# Patient Record
Sex: Female | Born: 1965 | Race: White | Hispanic: No | Smoking: Former smoker
Health system: Southern US, Community
[De-identification: ages and names within clinical notes are randomized; demographics above are authoritative.]

## PROBLEM LIST (undated history)

## (undated) DIAGNOSIS — I5032 Chronic diastolic (congestive) heart failure: Secondary | ICD-10-CM

## (undated) DIAGNOSIS — I2510 Atherosclerotic heart disease of native coronary artery without angina pectoris: Secondary | ICD-10-CM

## (undated) DIAGNOSIS — E782 Mixed hyperlipidemia: Secondary | ICD-10-CM

## (undated) DIAGNOSIS — I10 Essential (primary) hypertension: Secondary | ICD-10-CM

## (undated) HISTORY — DX: Atherosclerotic heart disease of native coronary artery without angina pectoris: I25.10

## (undated) HISTORY — DX: Chronic diastolic (congestive) heart failure: I50.32

## (undated) HISTORY — PX: TUBAL LIGATION: SHX77

## (undated) HISTORY — DX: Mixed hyperlipidemia: E78.2

## (undated) HISTORY — PX: CORONARY ARTERY BYPASS GRAFT: SHX141

## (undated) HISTORY — DX: Essential (primary) hypertension: I10

---

## 1998-03-06 ENCOUNTER — Emergency Department (HOSPITAL_COMMUNITY): Admission: EM | Admit: 1998-03-06 | Discharge: 1998-03-06 | Payer: Self-pay | Admitting: Emergency Medicine

## 1998-03-13 ENCOUNTER — Emergency Department (HOSPITAL_COMMUNITY): Admission: EM | Admit: 1998-03-13 | Discharge: 1998-03-13 | Payer: Self-pay | Admitting: Emergency Medicine

## 1998-05-11 ENCOUNTER — Emergency Department (HOSPITAL_COMMUNITY): Admission: EM | Admit: 1998-05-11 | Discharge: 1998-05-11 | Payer: Self-pay | Admitting: Emergency Medicine

## 2001-03-15 ENCOUNTER — Ambulatory Visit (HOSPITAL_COMMUNITY): Admission: AD | Admit: 2001-03-15 | Discharge: 2001-03-15 | Payer: Self-pay | Admitting: *Deleted

## 2001-04-21 ENCOUNTER — Ambulatory Visit (HOSPITAL_COMMUNITY): Admission: AD | Admit: 2001-04-21 | Discharge: 2001-04-21 | Payer: Self-pay | Admitting: *Deleted

## 2001-05-07 ENCOUNTER — Inpatient Hospital Stay (HOSPITAL_COMMUNITY): Admission: AD | Admit: 2001-05-07 | Discharge: 2001-05-09 | Payer: Self-pay | Admitting: *Deleted

## 2005-08-09 ENCOUNTER — Emergency Department (HOSPITAL_COMMUNITY): Admission: EM | Admit: 2005-08-09 | Discharge: 2005-08-09 | Payer: Self-pay | Admitting: Emergency Medicine

## 2005-09-20 ENCOUNTER — Emergency Department (HOSPITAL_COMMUNITY): Admission: EM | Admit: 2005-09-20 | Discharge: 2005-09-20 | Payer: Self-pay | Admitting: Emergency Medicine

## 2007-04-16 ENCOUNTER — Emergency Department (HOSPITAL_COMMUNITY): Admission: EM | Admit: 2007-04-16 | Discharge: 2007-04-16 | Payer: Self-pay | Admitting: Emergency Medicine

## 2007-04-28 ENCOUNTER — Emergency Department (HOSPITAL_COMMUNITY): Admission: EM | Admit: 2007-04-28 | Discharge: 2007-04-28 | Payer: Self-pay | Admitting: Emergency Medicine

## 2007-06-28 ENCOUNTER — Emergency Department (HOSPITAL_COMMUNITY): Admission: EM | Admit: 2007-06-28 | Discharge: 2007-06-28 | Payer: Self-pay | Admitting: Emergency Medicine

## 2008-01-15 IMAGING — CR DG CHEST 2V
2 series · 2 of 2 positions shown · non-contrast
Comparison: none

HISTORY: Cough, congestion, fever

CHEST 2 VIEWS:
No prior study for comparison.
Normal heart size, mediastinal contours, and pulmonary vascularity.
Minimal peribronchial thickening without infiltrate or effusion.
No pneumothorax.
Bones unremarkable.

[view not recorded (1 of 2)]
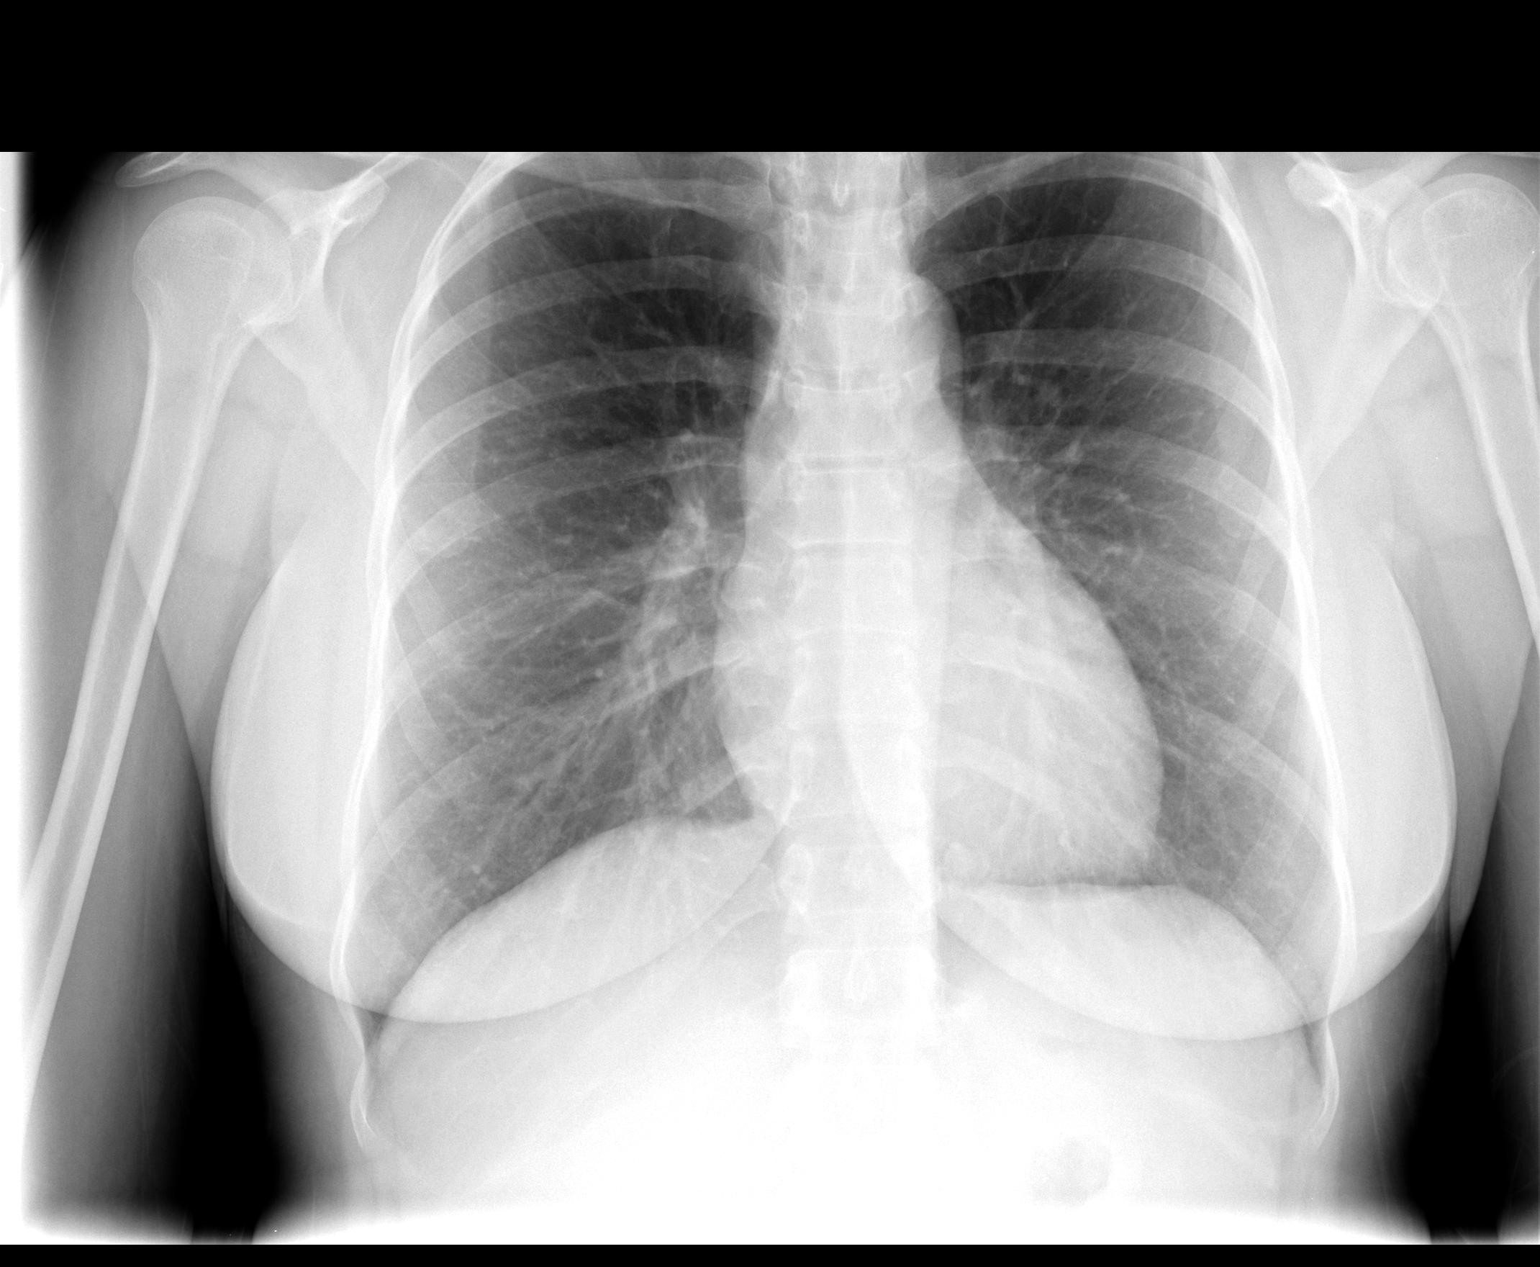

[view not recorded (2 of 2)]
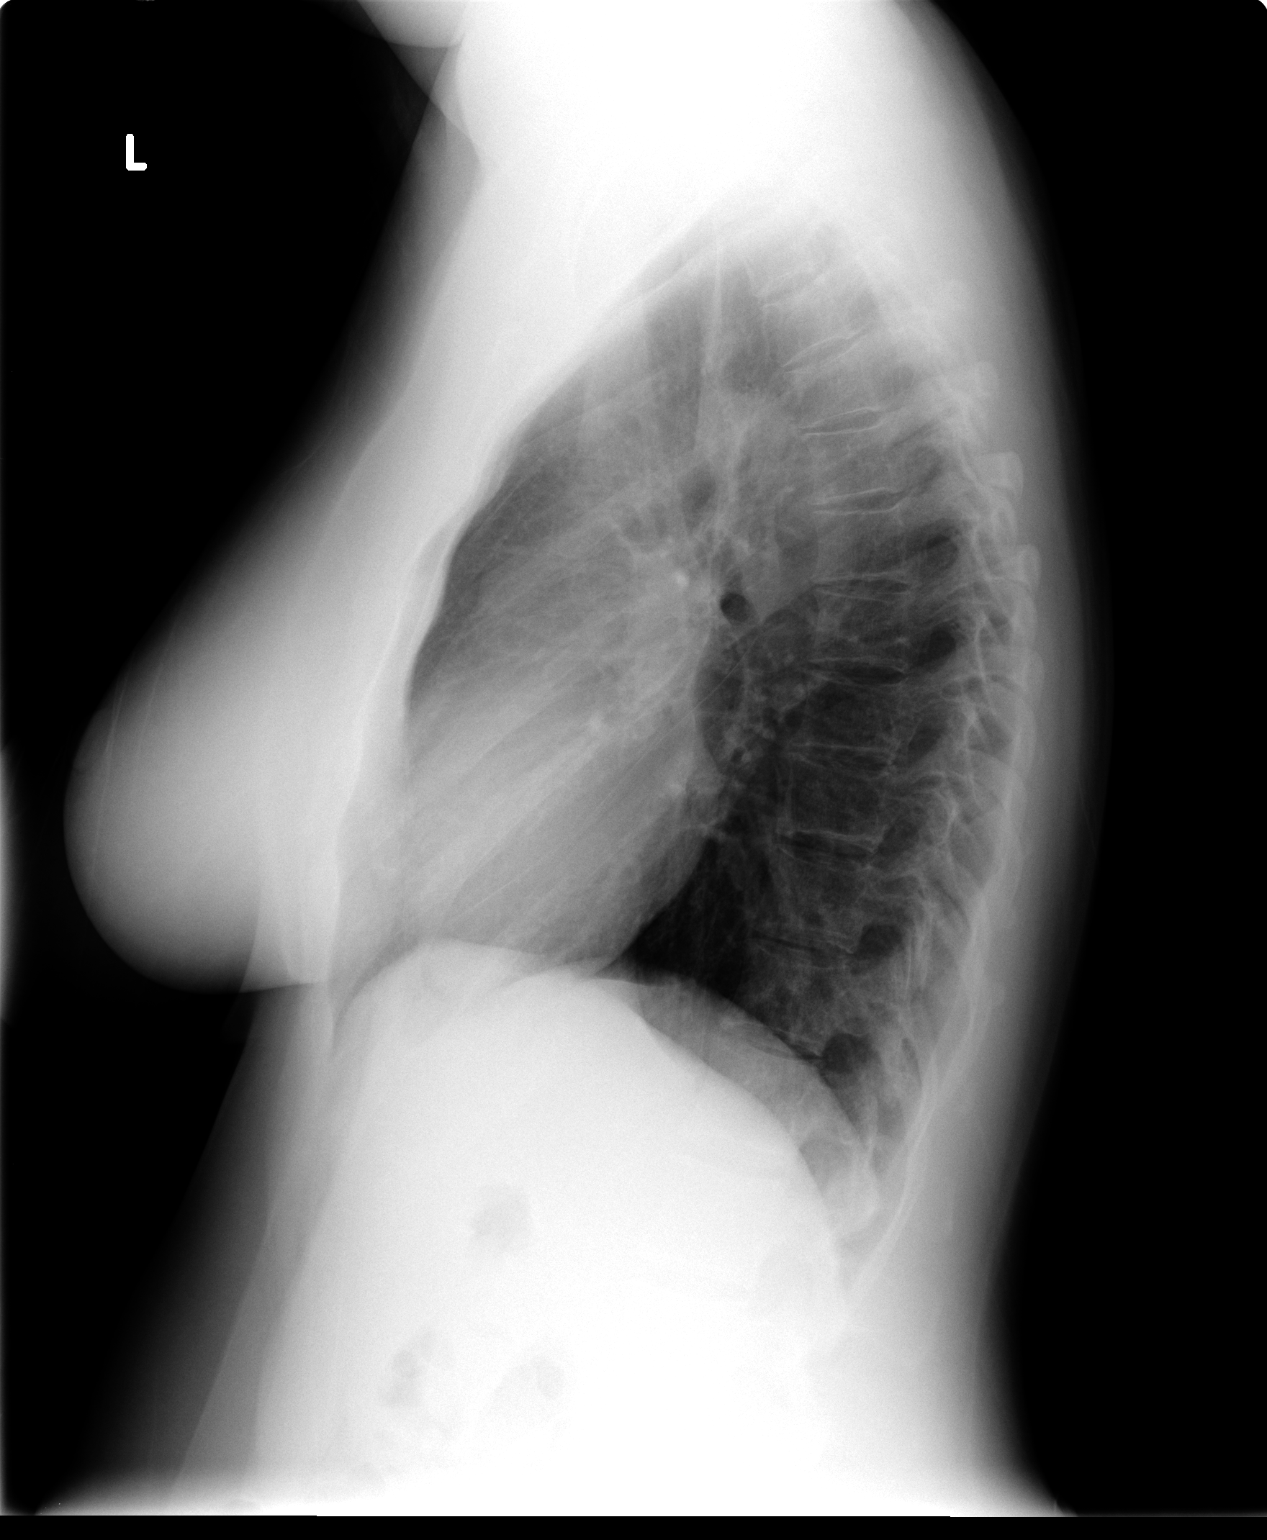

[2 of 2 positions shown; findings below may reference images not displayed]

IMPRESSION: Minimal bronchitic changes.

## 2015-01-17 DIAGNOSIS — I252 Old myocardial infarction: Secondary | ICD-10-CM | POA: Insufficient documentation

## 2015-01-18 DIAGNOSIS — E785 Hyperlipidemia, unspecified: Secondary | ICD-10-CM | POA: Insufficient documentation

## 2015-01-18 DIAGNOSIS — I10 Essential (primary) hypertension: Secondary | ICD-10-CM | POA: Insufficient documentation

## 2015-01-18 DIAGNOSIS — F419 Anxiety disorder, unspecified: Secondary | ICD-10-CM | POA: Insufficient documentation

## 2015-01-21 HISTORY — PX: CARDIAC CATHETERIZATION: SHX172

## 2015-01-25 DIAGNOSIS — I2510 Atherosclerotic heart disease of native coronary artery without angina pectoris: Secondary | ICD-10-CM | POA: Insufficient documentation

## 2017-12-31 DIAGNOSIS — H532 Diplopia: Secondary | ICD-10-CM | POA: Diagnosis not present

## 2017-12-31 DIAGNOSIS — H04123 Dry eye syndrome of bilateral lacrimal glands: Secondary | ICD-10-CM | POA: Diagnosis not present

## 2017-12-31 DIAGNOSIS — H52223 Regular astigmatism, bilateral: Secondary | ICD-10-CM | POA: Diagnosis not present

## 2018-07-20 DIAGNOSIS — R072 Precordial pain: Secondary | ICD-10-CM | POA: Diagnosis not present

## 2018-07-20 DIAGNOSIS — Z951 Presence of aortocoronary bypass graft: Secondary | ICD-10-CM | POA: Diagnosis not present

## 2018-07-20 DIAGNOSIS — E7800 Pure hypercholesterolemia, unspecified: Secondary | ICD-10-CM | POA: Diagnosis not present

## 2018-07-20 DIAGNOSIS — F410 Panic disorder [episodic paroxysmal anxiety] without agoraphobia: Secondary | ICD-10-CM | POA: Diagnosis not present

## 2018-07-20 DIAGNOSIS — I10 Essential (primary) hypertension: Secondary | ICD-10-CM | POA: Diagnosis not present

## 2018-07-20 DIAGNOSIS — F430 Acute stress reaction: Secondary | ICD-10-CM | POA: Diagnosis not present

## 2018-07-20 DIAGNOSIS — I2510 Atherosclerotic heart disease of native coronary artery without angina pectoris: Secondary | ICD-10-CM | POA: Diagnosis not present

## 2018-07-20 DIAGNOSIS — Z87891 Personal history of nicotine dependence: Secondary | ICD-10-CM | POA: Diagnosis not present

## 2018-07-20 DIAGNOSIS — F439 Reaction to severe stress, unspecified: Secondary | ICD-10-CM | POA: Diagnosis not present

## 2018-07-20 DIAGNOSIS — F419 Anxiety disorder, unspecified: Secondary | ICD-10-CM | POA: Diagnosis not present

## 2018-07-20 DIAGNOSIS — F329 Major depressive disorder, single episode, unspecified: Secondary | ICD-10-CM | POA: Diagnosis not present

## 2018-07-20 DIAGNOSIS — Z79899 Other long term (current) drug therapy: Secondary | ICD-10-CM | POA: Diagnosis not present

## 2018-07-20 DIAGNOSIS — R0602 Shortness of breath: Secondary | ICD-10-CM | POA: Diagnosis not present

## 2018-07-20 DIAGNOSIS — R05 Cough: Secondary | ICD-10-CM | POA: Diagnosis not present

## 2018-07-20 DIAGNOSIS — R079 Chest pain, unspecified: Secondary | ICD-10-CM | POA: Diagnosis not present

## 2018-08-21 DIAGNOSIS — F609 Personality disorder, unspecified: Secondary | ICD-10-CM | POA: Diagnosis not present

## 2018-09-18 DIAGNOSIS — F609 Personality disorder, unspecified: Secondary | ICD-10-CM | POA: Diagnosis not present

## 2018-10-04 ENCOUNTER — Ambulatory Visit: Payer: Self-pay | Admitting: Family

## 2018-10-08 ENCOUNTER — Encounter: Payer: Self-pay | Admitting: Family

## 2018-10-21 ENCOUNTER — Ambulatory Visit (INDEPENDENT_AMBULATORY_CARE_PROVIDER_SITE_OTHER): Payer: Medicaid Other | Admitting: Family

## 2018-10-21 ENCOUNTER — Encounter: Payer: Self-pay | Admitting: Family

## 2018-10-21 VITALS — BP 137/92 | HR 81 | Temp 97.6°F | Ht 62.0 in | Wt 164.2 lb

## 2018-10-21 DIAGNOSIS — E663 Overweight: Secondary | ICD-10-CM | POA: Insufficient documentation

## 2018-10-21 DIAGNOSIS — I2510 Atherosclerotic heart disease of native coronary artery without angina pectoris: Secondary | ICD-10-CM | POA: Diagnosis not present

## 2018-10-21 DIAGNOSIS — Z1211 Encounter for screening for malignant neoplasm of colon: Secondary | ICD-10-CM

## 2018-10-21 DIAGNOSIS — Z114 Encounter for screening for human immunodeficiency virus [HIV]: Secondary | ICD-10-CM | POA: Diagnosis not present

## 2018-10-21 DIAGNOSIS — F321 Major depressive disorder, single episode, moderate: Secondary | ICD-10-CM | POA: Diagnosis not present

## 2018-10-21 DIAGNOSIS — I10 Essential (primary) hypertension: Secondary | ICD-10-CM | POA: Diagnosis not present

## 2018-10-21 DIAGNOSIS — I252 Old myocardial infarction: Secondary | ICD-10-CM

## 2018-10-21 DIAGNOSIS — Z23 Encounter for immunization: Secondary | ICD-10-CM

## 2018-10-21 DIAGNOSIS — E785 Hyperlipidemia, unspecified: Secondary | ICD-10-CM | POA: Diagnosis not present

## 2018-10-21 DIAGNOSIS — E669 Obesity, unspecified: Secondary | ICD-10-CM | POA: Diagnosis not present

## 2018-10-21 DIAGNOSIS — G43109 Migraine with aura, not intractable, without status migrainosus: Secondary | ICD-10-CM

## 2018-10-21 DIAGNOSIS — F411 Generalized anxiety disorder: Secondary | ICD-10-CM

## 2018-10-21 DIAGNOSIS — G43909 Migraine, unspecified, not intractable, without status migrainosus: Secondary | ICD-10-CM | POA: Insufficient documentation

## 2018-10-21 MED ORDER — TOPIRAMATE 50 MG PO TABS
50.0000 mg | ORAL_TABLET | Freq: Two times a day (BID) | ORAL | 1 refills | Status: DC
Start: 2018-10-21 — End: 2019-04-22

## 2018-10-21 MED ORDER — ATORVASTATIN CALCIUM 40 MG PO TABS: 40.0000 mg | ORAL_TABLET | Freq: Every day | ORAL | 3 refills | Status: DC

## 2018-10-21 MED ORDER — METOPROLOL TARTRATE 25 MG PO TABS: 25.0000 mg | ORAL_TABLET | Freq: Two times a day (BID) | ORAL | 3 refills | Status: DC

## 2018-10-21 MED ORDER — GABAPENTIN 800 MG PO TABS: 800.0000 mg | ORAL_TABLET | Freq: Three times a day (TID) | ORAL | 3 refills | Status: DC

## 2018-10-21 MED ORDER — FUROSEMIDE 20 MG PO TABS
20.0000 mg | ORAL_TABLET | Freq: Every day | ORAL | 1 refills | Status: DC
Start: 2018-10-21 — End: 2019-05-30

## 2018-10-21 MED ORDER — SERTRALINE HCL 100 MG PO TABS: 100.0000 mg | ORAL_TABLET | Freq: Every day | ORAL | 5 refills | Status: DC

## 2018-10-21 MED ORDER — BUPROPION HCL ER (XL) 300 MG PO TB24
300.0000 mg | ORAL_TABLET | Freq: Every day | ORAL | 1 refills | Status: DC
Start: 2018-10-21 — End: 2019-08-25

## 2018-10-21 NOTE — Progress Notes (Signed)
Subjective:    Patient ID: Kristina Ramirez, female    DOB: 1966-09-04, 52 y.o.   MRN: [id number]  Chief Complaint  Patient presents with  . New Patient (Initial Visit)   Pt presents to the office today to establish care. PT has recently moved to the area. She states she had a NSTEMI in 02/2014. She has not seen a Cardiologists in the last few years.  Hypertension  This is a chronic problem. The current episode started more than 1 year ago. The problem has been waxing and waning since onset. The problem is uncontrolled. Associated symptoms include anxiety, malaise/fatigue and peripheral edema ("some times"). Pertinent negatives include no shortness of breath. Risk factors for coronary artery disease include diabetes mellitus, dyslipidemia and obesity. The current treatment provides moderate improvement. Hypertensive end-organ damage includes CAD/MI. There is no history of kidney disease, CVA or heart failure.  Hyperlipidemia  This is a chronic problem. The current episode started more than 1 year ago. Recent lipid tests were reviewed and are normal. Exacerbating diseases include obesity. Pertinent negatives include no shortness of breath. Current antihyperlipidemic treatment includes statins. The current treatment provides moderate improvement of lipids. Risk factors for coronary artery disease include dyslipidemia, diabetes mellitus, hypertension and a sedentary lifestyle.  Anxiety  Presents for follow-up visit. Symptoms include decreased concentration, depressed mood, excessive worry, irritability, nausea, nervous/anxious behavior and restlessness. Patient reports no shortness of breath. Symptoms occur most days. The severity of symptoms is moderate.    Depression         This is a chronic problem.  The current episode started more than 1 year ago.   The onset quality is gradual.   The problem occurs intermittently.  Associated symptoms include decreased concentration, irritable, restlessness and  appetite change.  Associated symptoms include no helplessness and no hopelessness.  Past treatments include SSRIs - Selective serotonin reuptake inhibitors.  Past medical history includes anxiety.   Migraine   This is a chronic problem. The current episode started more than 1 year ago. The problem occurs intermittently. The problem has been waxing and waning. The pain quality is similar to prior headaches. Associated symptoms include nausea, phonophobia, photophobia and vomiting. Her past medical history is significant for hypertension and obesity.      Review of Systems  Constitutional: Positive for appetite change, irritability and malaise/fatigue.  Eyes: Positive for photophobia.  Respiratory: Negative for shortness of breath.   Gastrointestinal: Positive for nausea and vomiting.  Psychiatric/Behavioral: Positive for decreased concentration and depression. The patient is nervous/anxious.   All other systems reviewed and are negative.  History reviewed. No pertinent family history.  Social History   Socioeconomic History  . Marital status: Single    Spouse name: Not on file  . Number of children: Not on file  . Years of education: Not on file  . Highest education level: Not on file  Occupational History  . Not on file  Social Needs  . Financial resource strain: Not on file  . Food insecurity:    Worry: Not on file    Inability: Not on file  . Transportation needs:    Medical: Not on file    Non-medical: Not on file  Tobacco Use  . Smoking status: Former Research scientist (life sciences)  . Smokeless tobacco: Never Used  Substance and Sexual Activity  . Alcohol use: Never    Frequency: Never  . Drug use: Never  . Sexual activity: Yes  Lifestyle  . Physical activity:    Days  per week: Not on file    Minutes per session: Not on file  . Stress: Not on file  Relationships  . Social connections:    Talks on phone: Not on file    Gets together: Not on file    Attends religious service: Not on  file    Active member of club or organization: Not on file    Attends meetings of clubs or organizations: Not on file    Relationship status: Not on file  Other Topics Concern  . Not on file  Social History Narrative  . Not on file       Objective:   Physical Exam Vitals signs reviewed.  Constitutional:      General: She is irritable. She is not in acute distress.    Appearance: She is well-developed.  HENT:     Head: Normocephalic and atraumatic.     Right Ear: Tympanic membrane normal.     Left Ear: Tympanic membrane normal.  Eyes:     Pupils: Pupils are equal, round, and reactive to light.  Neck:     Musculoskeletal: Normal range of motion and neck supple.     Thyroid: No thyromegaly.  Cardiovascular:     Rate and Rhythm: Normal rate and regular rhythm.     Heart sounds: Normal heart sounds. No murmur.  Pulmonary:     Effort: Pulmonary effort is normal. No respiratory distress.     Breath sounds: Normal breath sounds. No wheezing.  Abdominal:     General: Bowel sounds are normal. There is no distension.     Palpations: Abdomen is soft.     Tenderness: There is no abdominal tenderness.  Musculoskeletal: Normal range of motion.        General: No tenderness.  Skin:    General: Skin is warm and dry.  Neurological:     Mental Status: She is alert and oriented to person, place, and time.     Cranial Nerves: No cranial nerve deficit.     Deep Tendon Reflexes: Reflexes are normal and symmetric.  Psychiatric:        Behavior: Behavior normal.        Thought Content: Thought content normal.        Judgment: Judgment normal.       BP (!) 131/93   Pulse 83   Temp 97.6 F (36.4 C) (Oral)   Ht _0  (1.575 m)   Wt 164 lb 3.2 oz (74.5 kg)   BMI 30.03 kg/m      Assessment & Plan:  Kristina Ramirez comes in today with chief complaint of New Patient (Initial Visit)   Diagnosis and orders addressed:  1. Depression, major, single episode, moderate (HCC) We will  increase zoloft to 100 mg from 25 mg Stress management discussed - CMP14+EGFR - CBC with Differential/Platelet - buPROPion (WELLBUTRIN XL) 300 MG 24 hr tablet; Take 1 tablet (300 mg total) by mouth daily.  Dispense: 90 tablet; Refill: 1 - sertraline (ZOLOFT) 100 MG tablet; Take 1 tablet (100 mg total) by mouth daily.  Dispense: 30 tablet; Refill: 5  2. Hyperlipidemia, unspecified hyperlipidemia type - CMP14+EGFR - CBC with Differential/Platelet - Lipid panel - atorvastatin (LIPITOR) 40 MG tablet; Take 1 tablet (40 mg total) by mouth at bedtime.  Dispense: 90 tablet; Refill: 3  3. Generalized anxiety disorder We will increase zoloft to 100 mg from 25 mg Stress management discussed - CMP14+EGFR - CBC with Differential/Platelet - buPROPion (WELLBUTRIN XL) 300 MG 24  hr tablet; Take 1 tablet (300 mg total) by mouth daily.  Dispense: 90 tablet; Refill: 1 - sertraline (ZOLOFT) 100 MG tablet; Take 1 tablet (100 mg total) by mouth daily.  Dispense: 30 tablet; Refill: 5  4. History of non-ST elevation myocardial infarction (NSTEMI) - CMP14+EGFR - CBC with Differential/Platelet - Ambulatory referral to Cardiology - furosemide (LASIX) 20 MG tablet; Take 1 tablet (20 mg total) by mouth daily.  Dispense: 90 tablet; Refill: 1  5. Essential (primary) hypertension - CMP14+EGFR - CBC with Differential/Platelet - furosemide (LASIX) 20 MG tablet; Take 1 tablet (20 mg total) by mouth daily.  Dispense: 90 tablet; Refill: 1 - metoprolol tartrate (LOPRESSOR) 25 MG tablet; Take 1 tablet (25 mg total) by mouth 2 (two) times daily.  Dispense: 90 tablet; Refill: 3  6. Atherosclerosis of native coronary artery without angina pectoris, unspecified whether native or transplanted heart - CMP14+EGFR - CBC with Differential/Platelet  7. Migraine with aura and without status migrainosus, not intractable - CMP14+EGFR - CBC with Differential/Platelet - topiramate (TOPAMAX) 50 MG tablet; Take 1 tablet (50 mg  total) by mouth 2 (two) times daily.  Dispense: 180 tablet; Refill: 1  8. Colon cancer screening - CMP14+EGFR - CBC with Differential/Platelet - Ambulatory referral to Gastroenterology  9. Encounter for screening for HIV - CMP14+EGFR - CBC with Differential/Platelet - HIV Antibody (routine testing w rflx)  10. Obesity (BMI 30-39.9)   Labs pending Health Maintenance reviewed Diet and exercise encouraged  Follow up plan: 6 months    Evelina Dun, FNP

## 2018-10-21 NOTE — Patient Instructions (Signed)
Living With Depression Everyone experiences occasional disappointment, sadness, and loss in their lives. When you are feeling down, blue, or sad for at least 2 weeks in a row, it may mean that you have depression. Depression can affect your thoughts and feelings, relationships, daily activities, and physical health. It is caused by changes in the way your brain functions. If you receive a diagnosis of depression, your health care provider will tell you which type of depression you have and what treatment options are available to you. If you are living with depression, there are ways to help you recover from it and also ways to prevent it from coming back. How to cope with lifestyle changes Coping with stress Stress is your body's reaction to life changes and events, both good and bad. Stressful situations may include:  Getting married.  The death of a spouse.  Losing a job.  Retiring.  Having a baby.  Stress can last just a few hours or it can be ongoing. Stress can play a major role in depression, so it is important to learn both how to cope with stress and how to think about it differently. Talk with your health care provider or a counselor if you would like to learn more about stress reduction. He or she may suggest some stress reduction techniques, such as:  Music therapy. This can include creating music or listening to music. Choose music that you enjoy and that inspires you.  Mindfulness-based meditation. This kind of meditation can be done while sitting or walking. It involves being aware of your normal breaths, rather than trying to control your breathing.  Centering prayer. This is a kind of meditation that involves focusing on a spiritual word or phrase. Choose a word, phrase, or sacred image that is meaningful to you and that brings you peace.  Deep breathing. To do this, expand your stomach and inhale slowly through your nose. Hold your breath for 3-5 seconds, then exhale  slowly, allowing your stomach muscles to relax.  Muscle relaxation. This involves intentionally tensing muscles then relaxing them.  Choose a stress reduction technique that fits your lifestyle and personality. Stress reduction techniques take time and practice to develop. Set aside 5-15 minutes a day to do them. Therapists can offer training in these techniques. The training may be covered by some insurance plans. Other things you can do to manage stress include:  Keeping a stress diary. This can help you learn what triggers your stress and ways to control your response.  Understanding what your limits are and saying no to requests or events that lead to a schedule that is too full.  Thinking about how you respond to certain situations. You may not be able to control everything, but you can control how you react.  Adding humor to your life by watching funny films or TV shows.  Making time for activities that help you relax and not feeling guilty about spending your time this way.  Medicines Your health care provider may suggest certain medicines if he or she feels that they will help improve your condition. Avoid using alcohol and other substances that may prevent your medicines from working properly (may interact). It is also important to:  Talk with your pharmacist or health care provider about all the medicines that you take, their possible side effects, and what medicines are safe to take together.  Make it your goal to take part in all treatment decisions (shared decision-making). This includes giving input on the side   effects of medicines. It is best if shared decision-making with your health care provider is part of your total treatment plan.  If your health care provider prescribes a medicine, you may not notice the full benefits of it for 4-8 weeks. Most people who are treated for depression need to be on medicine for at least 6-12 months after they feel better. If you are taking  medicines as part of your treatment, do not stop taking medicines without first talking to your health care provider. You may need to have the medicine slowly decreased (tapered) over time to decrease the risk of harmful side effects. Relationships Your health care provider may suggest family therapy along with individual therapy and drug therapy. While there may not be family problems that are causing you to feel depressed, it is still important to make sure your family learns as much as they can about your mental health. Having your family's support can help make your treatment successful. How to recognize changes in your condition Everyone has a different response to treatment for depression. Recovery from major depression happens when you have not had signs of major depression for two months. This may mean that you will start to:  Have more interest in doing activities.  Feel less hopeless than you did 2 months ago.  Have more energy.  Overeat less often, or have better or improving appetite.  Have better concentration.  Your health care provider will work with you to decide the next steps in your recovery. It is also important to recognize when your condition is getting worse. Watch for these signs:  Having fatigue or low energy.  Eating too much or too little.  Sleeping too much or too little.  Feeling restless, agitated, or hopeless.  Having trouble concentrating or making decisions.  Having unexplained physical complaints.  Feeling irritable, angry, or aggressive.  Get help as soon as you or your family members notice these symptoms coming back. How to get support and help from others How to talk with friends and family members about your condition Talking to friends and family members about your condition can provide you with one way to get support and guidance. Reach out to trusted friends or family members, explain your symptoms to them, and let them know that you are  working with a health care provider to treat your depression. Financial resources Not all insurance plans cover mental health care, so it is important to check with your insurance carrier. If paying for co-pays or counseling services is a problem, search for a local or county mental health care center. They may be able to offer public mental health care services at low or no cost when you are not able to see a private health care provider. If you are taking medicine for depression, you may be able to get the generic form, which may be less expensive. Some makers of prescription medicines also offer help to patients who cannot afford the medicines they need. Follow these instructions at home:  Get the right amount and quality of sleep.  Cut down on using caffeine, tobacco, alcohol, and other potentially harmful substances.  Try to exercise, such as walking or lifting small weights.  Take over-the-counter and prescription medicines only as told by your health care provider.  Eat a healthy diet that includes plenty of vegetables, fruits, whole grains, low-fat dairy products, and lean protein. Do not eat a lot of foods that are high in solid fats, added sugars, or salt.    Keep all follow-up visits as told by your health care provider. This is important. Contact a health care provider if:  You stop taking your antidepressant medicines, and you have any of these symptoms: ? Nausea. ? Headache. ? Feeling lightheaded. ? Chills and body aches. ? Not being able to sleep (insomnia).  You or your friends and family think your depression is getting worse. Get help right away if:  You have thoughts of hurting yourself or others. If you ever feel like you may hurt yourself or others, or have thoughts about taking your own life, get help right away. You can go to your nearest emergency department or call:  Your local emergency services (911 in the [place]  A suicide crisis helpline, such as the  National Suicide Prevention Lifeline at 1-800-273-8255. This is open 24-hours a day.  Summary  If you are living with depression, there are ways to help you recover from it and also ways to prevent it from coming back.  Work with your health care team to create a management plan that includes counseling, stress management techniques, and healthy lifestyle habits. This information is not intended to replace advice given to you by your health care provider. Make sure you discuss any questions you have with your health care provider. Document Released: 09/25/2016 Document Revised: 09/25/2016 Document Reviewed: 09/25/2016 Elsevier Interactive Patient Education  2018 [employer]  

## 2018-10-22 ENCOUNTER — Other Ambulatory Visit: Payer: Self-pay | Admitting: Family

## 2018-10-22 DIAGNOSIS — E785 Hyperlipidemia, unspecified: Secondary | ICD-10-CM

## 2018-10-22 LAB — LIPID PANEL
Chol/HDL Ratio: 5.4 ratio — ABNORMAL HIGH (ref 0.0–4.4)
Cholesterol, Total: 238 mg/dL — ABNORMAL HIGH (ref 100–199)
HDL: 44 mg/dL (ref >39)
Triglycerides: 423 mg/dL — ABNORMAL HIGH (ref 0–149)

## 2018-10-22 LAB — CBC WITH DIFFERENTIAL/PLATELET
Basophils Absolute: 0.1 10*3/uL (ref 0.0–0.2)
Basos: 1 %
EOS (ABSOLUTE): 0.3 10*3/uL (ref 0.0–0.4)
Eos: 3 %
Hematocrit: 35.9 % (ref 34.0–46.6)
Hemoglobin: 12 g/dL (ref 11.1–15.9)
Immature Grans (Abs): 0 10*3/uL (ref 0.0–0.1)
Immature Granulocytes: 0 %
Lymphocytes Absolute: 2.3 10*3/uL (ref 0.7–3.1)
Lymphs: 24 %
MCH: 27.5 pg (ref 26.6–33.0)
MCHC: 33.4 g/dL (ref 31.5–35.7)
MCV: 82 fL (ref 79–97)
Monocytes Absolute: 0.6 10*3/uL (ref 0.1–0.9)
Monocytes: 7 %
Neutrophils Absolute: 6.4 10*3/uL (ref 1.4–7.0)
Neutrophils: 65 %
Platelets: 292 10*3/uL (ref 150–450)
RBC: 4.36 x10E6/uL (ref 3.77–5.28)
RDW: 14.2 % (ref 12.3–15.4)
WBC: 9.8 10*3/uL (ref 3.4–10.8)

## 2018-10-22 LAB — CMP14+EGFR
ALT: 16 IU/L (ref 0–32)
AST: 17 IU/L (ref 0–40)
Albumin/Globulin Ratio: 2.1 (ref 1.2–2.2)
Albumin: 4.6 g/dL (ref 3.5–5.5)
Alkaline Phosphatase: 105 IU/L (ref 39–117)
BUN/Creatinine Ratio: 17 (ref 9–23)
BUN: 18 mg/dL (ref 6–24)
Bilirubin Total: 0.2 mg/dL (ref 0.0–1.2)
CO2: 20 mmol/L (ref 20–29)
Calcium: 10.4 mg/dL — ABNORMAL HIGH (ref 8.7–10.2)
Chloride: 107 mmol/L — ABNORMAL HIGH (ref 96–106)
Creatinine, Ser: 1.06 mg/dL — ABNORMAL HIGH (ref 0.57–1.00)
GFR calc Af Amer: 69 mL/min/{1.73_m2} (ref >59)
GFR calc non Af Amer: 60 mL/min/{1.73_m2} (ref >59)
Globulin, Total: 2.2 g/dL (ref 1.5–4.5)
Glucose: 93 mg/dL (ref 65–99)
Potassium: 4.7 mmol/L (ref 3.5–5.2)
Sodium: 145 mmol/L — ABNORMAL HIGH (ref 134–144)
Total Protein: 6.8 g/dL (ref 6.0–8.5)

## 2018-10-22 LAB — HIV ANTIBODY (ROUTINE TESTING W REFLEX): HIV Screen 4th Generation wRfx: NONREACTIVE

## 2018-10-22 MED ORDER — ATORVASTATIN CALCIUM 40 MG PO TABS: 40.0000 mg | ORAL_TABLET | Freq: Every day | ORAL | 3 refills | Status: DC

## 2018-10-24 ENCOUNTER — Encounter: Payer: Self-pay | Admitting: Internal Medicine

## 2018-11-04 ENCOUNTER — Ambulatory Visit: Payer: Medicaid Other | Admitting: Cardiovascular Disease

## 2018-11-04 ENCOUNTER — Encounter: Payer: Self-pay | Admitting: Cardiovascular Disease

## 2018-11-04 VITALS — BP 112/75 | HR 66 | Ht 62.0 in | Wt 162.0 lb

## 2018-11-04 DIAGNOSIS — I5032 Chronic diastolic (congestive) heart failure: Secondary | ICD-10-CM | POA: Diagnosis not present

## 2018-11-04 DIAGNOSIS — E782 Mixed hyperlipidemia: Secondary | ICD-10-CM

## 2018-11-04 DIAGNOSIS — I25708 Atherosclerosis of coronary artery bypass graft(s), unspecified, with other forms of angina pectoris: Secondary | ICD-10-CM | POA: Diagnosis not present

## 2018-11-04 DIAGNOSIS — I10 Essential (primary) hypertension: Secondary | ICD-10-CM

## 2018-11-04 DIAGNOSIS — E785 Hyperlipidemia, unspecified: Secondary | ICD-10-CM | POA: Diagnosis not present

## 2018-11-04 DIAGNOSIS — F329 Major depressive disorder, single episode, unspecified: Secondary | ICD-10-CM | POA: Diagnosis not present

## 2018-11-04 MED ORDER — ASPIRIN EC 81 MG PO TBEC: 81.0000 mg | DELAYED_RELEASE_TABLET | Freq: Every day | ORAL | Status: DC

## 2018-11-04 MED ORDER — ATORVASTATIN CALCIUM 80 MG PO TABS: 80.0000 mg | ORAL_TABLET | Freq: Every day | ORAL | 6 refills | Status: DC

## 2018-11-04 NOTE — Progress Notes (Signed)
CARDIOLOGY CONSULT NOTE  Patient ID: Kristina Ramirez MRN: [id number] DOB/AGE: 53-Jun-1967 53 y.o.  Admit date: (Not on file) Primary Physician: Junie SpencerHawks, Christy A, FNP Referring Physician: Junie SpencerHawks, Christy A, FNP  Reason for Consultation: Coronary artery disease  HPI: Kristina Ramirez is a 52 y.o. female who is being seen today for the evaluation of coronary artery disease at the request of Junie SpencerHawks, Christy A, FNP.   I reviewed the EMR.  It appears she has a history of coronary artery disease status post three-vessel CABG with LIMA to the LAD and D1 and SVG to the RCA.  There was residual nonobstructive disease in the left circumflex.  She also has chronic diastolic heart failure and was previously followed by Dr. Tobi BastosKatie Twomley, most recently on 03/01/2016.  I reviewed the cardiac catheterization report dated 01/21/2015.  This was prior to CABG.  She is here with her fianc and adult daughter.  She told me she was initially 200 pounds and is now down to 162 pounds.  She had been walking at a local gym on a regular basis but lost her membership.  She avoids salt and tries to eat healthy foods.  She has had some short-term memory loss ever since CABG.  She has a history of panic attacks dating back to 2012.  When they occur she experiences palpitations and chest pain.  She otherwise denies exertional chest pain and dyspnea.  She denies leg swelling, orthopnea, and paroxysmal nocturnal dyspnea.  Family history: Father died of coronary artery disease.   No Known Allergies  Current Outpatient Medications  Medication Sig Dispense Refill  . atorvastatin (LIPITOR) 40 MG tablet Take 1 tablet (40 mg total) by mouth at bedtime. 90 tablet 3  . buPROPion (WELLBUTRIN XL) 300 MG 24 hr tablet Take 1 tablet (300 mg total) by mouth daily. 90 tablet 1  . cetirizine (ZYRTEC) 10 MG tablet Take 10 mg by mouth daily.  9  . furosemide (LASIX) 20 MG tablet Take 1 tablet (20 mg total) by mouth daily. 90 tablet 1  .  gabapentin (NEURONTIN) 800 MG tablet Take 1 tablet (800 mg total) by mouth 3 (three) times daily. 90 tablet 3  . metoprolol tartrate (LOPRESSOR) 25 MG tablet Take 1 tablet (25 mg total) by mouth 2 (two) times daily. 90 tablet 3  . naproxen (NAPROSYN) 500 MG tablet Take 500 mg by mouth daily.    . sertraline (ZOLOFT) 100 MG tablet Take 1 tablet (100 mg total) by mouth daily. 30 tablet 5  . topiramate (TOPAMAX) 50 MG tablet Take 1 tablet (50 mg total) by mouth 2 (two) times daily. 180 tablet 1   No current facility-administered medications for this visit.     History reviewed. No pertinent past medical history.  Past Surgical History:  Procedure Laterality Date  . CORONARY ARTERY BYPASS GRAFT    . TUBAL LIGATION      Social History   Socioeconomic History  . Marital status: Single    Spouse name: Not on file  . Number of children: Not on file  . Years of education: Not on file  . Highest education level: Not on file  Occupational History  . Not on file  Social Needs  . Financial resource strain: Not on file  . Food insecurity:    Worry: Not on file    Inability: Not on file  . Transportation needs:    Medical: Not on file    Non-medical: Not on file  Tobacco Use  . Smoking status: Former Smoker    Last attempt to quit: 12/05/2013    Years since quitting: 4.9  . Smokeless tobacco: Never Used  Substance and Sexual Activity  . Alcohol use: Never    Frequency: Never  . Drug use: Never  . Sexual activity: Yes  Lifestyle  . Physical activity:    Days per week: Not on file    Minutes per session: Not on file  . Stress: Not on file  Relationships  . Social connections:    Talks on phone: Not on file    Gets together: Not on file    Attends religious service: Not on file    Active member of club or organization: Not on file    Attends meetings of clubs or organizations: Not on file    Relationship status: Not on file  . Intimate partner violence:    Fear of current or  ex partner: Not on file    Emotionally abused: Not on file    Physically abused: Not on file    Forced sexual activity: Not on file  Other Topics Concern  . Not on file  Social History Narrative  . Not on file      Current Meds  Medication Sig  . atorvastatin (LIPITOR) 40 MG tablet Take 1 tablet (40 mg total) by mouth at bedtime.  Marland Kitchen buPROPion (WELLBUTRIN XL) 300 MG 24 hr tablet Take 1 tablet (300 mg total) by mouth daily.  . cetirizine (ZYRTEC) 10 MG tablet Take 10 mg by mouth daily.  . furosemide (LASIX) 20 MG tablet Take 1 tablet (20 mg total) by mouth daily.  Marland Kitchen gabapentin (NEURONTIN) 800 MG tablet Take 1 tablet (800 mg total) by mouth 3 (three) times daily.  . metoprolol tartrate (LOPRESSOR) 25 MG tablet Take 1 tablet (25 mg total) by mouth 2 (two) times daily.  . naproxen (NAPROSYN) 500 MG tablet Take 500 mg by mouth daily.  . sertraline (ZOLOFT) 100 MG tablet Take 1 tablet (100 mg total) by mouth daily.  Marland Kitchen topiramate (TOPAMAX) 50 MG tablet Take 1 tablet (50 mg total) by mouth 2 (two) times daily.      Review of systems complete and found to be negative unless listed above in HPI    Physical exam Blood pressure 112/75, pulse 66, height 5\' 2"  (1.575 m), weight 162 lb (73.5 kg), SpO2 97 %. General: NAD Neck: No JVD, no thyromegaly or thyroid nodule.  Lungs: Clear to auscultation bilaterally with normal respiratory effort. CV: Nondisplaced PMI. Regular rate and rhythm, normal S1/S2, no S3/S4, no murmur.  No peripheral edema.  No carotid bruit.    Abdomen: Soft, nontender, no distention.  Skin: Intact without lesions or rashes.  Neurologic: Alert and oriented x 3.  Psych: Normal affect. Extremities: No clubbing or cyanosis.  HEENT: Normal.   ECG: Most recent ECG reviewed.   Labs: Lab Results  Component Value Date/Time   K 4.7 10/21/2018 10:35 AM   BUN 18 10/21/2018 10:35 AM   CREATININE 1.06 (H) 10/21/2018 10:35 AM   ALT 16 10/21/2018 10:35 AM   HGB 12.0  10/21/2018 10:35 AM     Lipids: Lab Results  Component Value Date/Time   LDLCALC Comment 10/21/2018 10:35 AM   CHOL 238 (H) 10/21/2018 10:35 AM   TRIG 423 (H) 10/21/2018 10:35 AM   HDL 44 10/21/2018 10:35 AM        ASSESSMENT AND PLAN:  1.  Coronary artery disease: Status post  three-vessel CABG in 2016.  Symptomatically stable.  Currently on atorvastatin and Toprol-XL.  I encouraged her to begin taking aspirin 81 mg daily.  Due to elevated lipids, I will increase atorvastatin 80 mg and repeat lipids in 3 months.  2.  Hypertension: Blood pressure is normal.  No changes to therapy.  3.  Chronic diastolic heart failure: Currently on Lasix 20 mg daily.  Euvolemic.  4.  Mixed dyslipidemia: Lipids reviewed above with elevated total cholesterol and markedly elevated triglycerides.  I will increase atorvastatin from 40 to 80 mg and repeat lipids in 3 months.  I educated her on the importance of dietary and exercise modification.   Disposition: Follow up in 6 months  Signed: Prentice DockerSuresh Koneswaran, M.D., F.A.C.C.  11/04/2018, 1:04 PM

## 2018-11-04 NOTE — Patient Instructions (Signed)
Medication Instructions:   Begin Aspirin 81mg  daily.  Increase Lipitor to 80mg  daily.  Continue all other medications.    Labwork:  Lipid panel - due in 3 months.   Will mail reminder when time.   Testing/Procedures: none  Follow-Up: Your physician wants you to follow up in: 6 months.  You will receive a reminder letter in the mail one-two months in advance.  If you don't receive a letter, please call our office to schedule the follow up appointment   Any Other Special Instructions Will Be Listed Below (If Applicable).  If you need a refill on your cardiac medications before your next appointment, please call your pharmacy.

## 2018-12-11 DIAGNOSIS — F609 Personality disorder, unspecified: Secondary | ICD-10-CM | POA: Diagnosis not present

## 2019-01-07 ENCOUNTER — Telehealth: Payer: Self-pay | Admitting: Internal Medicine

## 2019-01-07 ENCOUNTER — Ambulatory Visit: Payer: Self-pay | Admitting: Gastroenterology

## 2019-01-07 ENCOUNTER — Encounter: Payer: Self-pay | Admitting: Internal Medicine

## 2019-01-07 NOTE — Telephone Encounter (Signed)
PATIENT WAS A NO SHOW AND LETTER SENT  °

## 2019-01-17 DIAGNOSIS — F419 Anxiety disorder, unspecified: Secondary | ICD-10-CM | POA: Diagnosis not present

## 2019-01-25 DIAGNOSIS — S92514A Nondisplaced fracture of proximal phalanx of right lesser toe(s), initial encounter for closed fracture: Secondary | ICD-10-CM | POA: Diagnosis not present

## 2019-01-25 DIAGNOSIS — M79671 Pain in right foot: Secondary | ICD-10-CM | POA: Diagnosis not present

## 2019-02-27 ENCOUNTER — Encounter: Payer: Self-pay | Admitting: *Deleted

## 2019-03-07 DIAGNOSIS — F419 Anxiety disorder, unspecified: Secondary | ICD-10-CM | POA: Diagnosis not present

## 2019-03-12 ENCOUNTER — Other Ambulatory Visit: Payer: Self-pay | Admitting: *Deleted

## 2019-03-12 DIAGNOSIS — E782 Mixed hyperlipidemia: Secondary | ICD-10-CM

## 2019-04-15 ENCOUNTER — Other Ambulatory Visit: Payer: Self-pay | Admitting: Family

## 2019-04-21 ENCOUNTER — Other Ambulatory Visit: Payer: Self-pay

## 2019-04-22 ENCOUNTER — Encounter: Payer: Self-pay | Admitting: Family

## 2019-04-22 ENCOUNTER — Ambulatory Visit (INDEPENDENT_AMBULATORY_CARE_PROVIDER_SITE_OTHER): Payer: Medicaid Other | Admitting: Family

## 2019-04-22 DIAGNOSIS — I10 Essential (primary) hypertension: Secondary | ICD-10-CM

## 2019-04-22 DIAGNOSIS — E669 Obesity, unspecified: Secondary | ICD-10-CM

## 2019-04-22 DIAGNOSIS — I252 Old myocardial infarction: Secondary | ICD-10-CM

## 2019-04-22 DIAGNOSIS — F321 Major depressive disorder, single episode, moderate: Secondary | ICD-10-CM

## 2019-04-22 DIAGNOSIS — I2510 Atherosclerotic heart disease of native coronary artery without angina pectoris: Secondary | ICD-10-CM | POA: Diagnosis not present

## 2019-04-22 DIAGNOSIS — I5032 Chronic diastolic (congestive) heart failure: Secondary | ICD-10-CM

## 2019-04-22 DIAGNOSIS — F411 Generalized anxiety disorder: Secondary | ICD-10-CM

## 2019-04-22 DIAGNOSIS — E785 Hyperlipidemia, unspecified: Secondary | ICD-10-CM | POA: Diagnosis not present

## 2019-04-22 DIAGNOSIS — G43109 Migraine with aura, not intractable, without status migrainosus: Secondary | ICD-10-CM | POA: Diagnosis not present

## 2019-04-22 MED ORDER — TOPIRAMATE 100 MG PO TABS: 100.0000 mg | ORAL_TABLET | Freq: Two times a day (BID) | ORAL | 1 refills | Status: DC

## 2019-04-22 MED ORDER — SERTRALINE HCL 100 MG PO TABS: 150.0000 mg | ORAL_TABLET | Freq: Every day | ORAL | 5 refills | Status: DC

## 2019-04-22 NOTE — Progress Notes (Signed)
Virtual Visit via telephone Note   Attempted to call patient at 8:35 no answer, voicemail left.   I connected with Kristina Ramirez on 04/22/19 at 8:58 AM by telephone and verified that I am speaking with the correct person using two identifiers. Kristina Ramirez is currently located at home and no one  is currently with her during visit. The provider, Evelina Dun, FNP is located in their office at time of visit.  I discussed the limitations, risks, security and privacy concerns of performing an evaluation and management service by telephone and the availability of in person appointments. I also discussed with the patient that there may be a patient responsible charge related to this service. The patient expressed understanding and agreed to proceed.   History and Present Illness:  Pt calls the office today for chronic follow up.  She states she had a NSTEMI in 02/2014. She is followed by Cardiologists every 6 months.   Hypertension This is a chronic problem. The current episode started more than 1 year ago. The problem has been resolved since onset. The problem is controlled. Associated symptoms include anxiety and malaise/fatigue. Pertinent negatives include no peripheral edema or shortness of breath. The current treatment provides moderate improvement. Hypertensive end-organ damage includes CAD/MI and heart failure.  Hyperlipidemia This is a chronic problem. The current episode started more than 1 year ago. The problem is uncontrolled. Recent lipid tests were reviewed and are high. Exacerbating diseases include obesity. Pertinent negatives include no shortness of breath. Current antihyperlipidemic treatment includes statins. The current treatment provides mild improvement of lipids. Risk factors for coronary artery disease include dyslipidemia, hypertension, a sedentary lifestyle and post-menopausal.  Depression        This is a chronic problem.  The current episode started more than 1 year ago.   The  onset quality is gradual.   The problem occurs intermittently.  The problem has been waxing and waning since onset.  Associated symptoms include decreased concentration, helplessness, irritable, restlessness, decreased interest and sad.  Associated symptoms include no hopelessness.  Compliance with treatment is good.  Past medical history includes anxiety.   Anxiety Presents for follow-up visit. Symptoms include decreased concentration, depressed mood, excessive worry, irritability, nausea, nervous/anxious behavior and restlessness. Patient reports no shortness of breath. Symptoms occur constantly. The severity of symptoms is moderate.    Migraine  This is a chronic problem. The current episode started more than 1 year ago. The problem occurs intermittently. The problem has been waxing and waning. Pain location: varies  The pain does not radiate. Associated symptoms include nausea, phonophobia and photophobia. She has tried beta blockers for the symptoms. The treatment provided mild relief. Her past medical history is significant for hypertension and obesity.      Review of Systems  Constitutional: Positive for irritability and malaise/fatigue.  Eyes: Positive for photophobia.  Respiratory: Negative for shortness of breath.   Gastrointestinal: Positive for nausea.  Psychiatric/Behavioral: Positive for decreased concentration and depression. The patient is nervous/anxious.   All other systems reviewed and are negative.    Observations/Objective: No SOB or distress   Assessment and Plan: Kristina Ramirez comes in today with chief complaint of No chief complaint on file.   Diagnosis and orders addressed:  1. Essential (primary) hypertension  2. Migraine with aura and without status migrainosus, not intractable Will increase Topamax to 100 mg BID from 50 mg BID Stress management discussed Encouraged at least 8 hours of sleep - topiramate (TOPAMAX) 100 MG tablet; Take  1 tablet (100 mg total)  by mouth 2 (two) times daily.  Dispense: 180 tablet; Refill: 1  3. Obesity (BMI 30-39.9)  4. Hyperlipidemia, unspecified hyperlipidemia type  5. Generalized anxiety disorder Will increase zoloft to 150 mg from 100 mg  Stress management discussed - sertraline (ZOLOFT) 100 MG tablet; Take 1.5 tablets (150 mg total) by mouth daily.  Dispense: 45 tablet; Refill: 5  6. Atherosclerosis of native coronary artery without angina pectoris, unspecified whether native or transplanted heart  7. Depression, major, single episode, moderate (HCC) - sertraline (ZOLOFT) 100 MG tablet; Take 1.5 tablets (150 mg total) by mouth daily.  Dispense: 45 tablet; Refill: 5  8. History of non-ST elevation myocardial infarction (NSTEMI)  9. Chronic diastolic CHF (congestive heart failure) (HCC)  Follow up in 4 weeks to recheck Depression, GAD, and Migraines. We will plan to do lab work at this time too.     I discussed the assessment and treatment plan with the patient. The patient was provided an opportunity to ask questions and all were answered. The patient agreed with the plan and demonstrated an understanding of the instructions.   The patient was advised to call back or seek an in-person evaluation if the symptoms worsen or if the condition fails to improve as anticipated.  The above assessment and management plan was discussed with the patient. The patient verbalized understanding of and has agreed to the management plan. Patient is aware to call the clinic if symptoms persist or worsen. Patient is aware when to return to the clinic for a follow-up visit. Patient educated on when it is appropriate to go to the emergency department.   Time call ended:  9:19 AM  I provided 21 minutes of non-face-to-face time during this encounter.    Jannifer Rodneyhristy Hawks, FNP

## 2019-05-20 ENCOUNTER — Ambulatory Visit: Payer: Self-pay | Admitting: Family

## 2019-05-30 ENCOUNTER — Encounter: Payer: Self-pay | Admitting: Family

## 2019-05-30 ENCOUNTER — Other Ambulatory Visit: Payer: Self-pay

## 2019-05-30 ENCOUNTER — Ambulatory Visit (INDEPENDENT_AMBULATORY_CARE_PROVIDER_SITE_OTHER): Payer: Medicaid Other | Admitting: Family

## 2019-05-30 DIAGNOSIS — G43109 Migraine with aura, not intractable, without status migrainosus: Secondary | ICD-10-CM

## 2019-05-30 DIAGNOSIS — E785 Hyperlipidemia, unspecified: Secondary | ICD-10-CM | POA: Diagnosis not present

## 2019-05-30 DIAGNOSIS — I252 Old myocardial infarction: Secondary | ICD-10-CM

## 2019-05-30 DIAGNOSIS — I10 Essential (primary) hypertension: Secondary | ICD-10-CM | POA: Diagnosis not present

## 2019-05-30 DIAGNOSIS — R197 Diarrhea, unspecified: Secondary | ICD-10-CM

## 2019-05-30 MED ORDER — TRAZODONE HCL 50 MG PO TABS: 50.0000 mg | ORAL_TABLET | Freq: Every evening | ORAL | 3 refills | Status: DC | PRN

## 2019-05-30 MED ORDER — ATORVASTATIN CALCIUM 80 MG PO TABS: 80.0000 mg | ORAL_TABLET | Freq: Every day | ORAL | 6 refills | Status: DC

## 2019-05-30 MED ORDER — FUROSEMIDE 20 MG PO TABS: 20.0000 mg | ORAL_TABLET | Freq: Every day | ORAL | 1 refills | Status: DC

## 2019-05-30 MED ORDER — TOPIRAMATE 100 MG PO TABS: 100.0000 mg | ORAL_TABLET | Freq: Two times a day (BID) | ORAL | 1 refills | Status: DC

## 2019-05-30 NOTE — Progress Notes (Signed)
   Virtual Visit via telephone Note  I connected with Kristina Ramirez on 05/30/19 at 9:33 AM by telephone and verified that I am speaking with the correct person using two identifiers. Kristina Ramirez is currently located at home and finance  is currently with her during visit. The provider, Evelina Dun, FNP is located in their office at time of visit.  I discussed the limitations, risks, security and privacy concerns of performing an evaluation and management service by telephone and the availability of in person appointments. I also discussed with the patient that there may be a patient responsible charge related to this service. The patient expressed understanding and agreed to proceed.   History and Present Illness:  Migraine  This is a new problem. The current episode started in the past 7 days. The problem occurs constantly. The pain quality is similar to prior headaches. The quality of the pain is described as throbbing and aching. The pain is at a severity of 9/10. The pain is moderate. Associated symptoms include abdominal pain, nausea, phonophobia and photophobia. Pertinent negatives include no coughing, fever or vomiting. The symptoms are aggravated by emotional stress. The treatment provided moderate relief. Her past medical history is significant for migraine headaches.  Diarrhea  This is a new problem. The current episode started in the past 7 days. The problem occurs more than 10 times per day. The patient states that diarrhea awakens her from sleep. Associated symptoms include abdominal pain, bloating, headaches and increased flatus. Pertinent negatives include no chills, coughing, fever or vomiting. Associated symptoms comments: Nausea . She has tried anti-motility drug, change of diet and increased fluids for the symptoms. The treatment provided no relief.      Review of Systems  Constitutional: Negative for chills and fever.  Eyes: Positive for photophobia.  Respiratory: Negative for  cough.   Gastrointestinal: Positive for abdominal pain, bloating, diarrhea, flatus and nausea. Negative for vomiting.  Neurological: Positive for headaches.  All other systems reviewed and are negative.    Observations/Objective: No SOB or distress noted  Assessment and Plan: 1. Migraine with aura and without status migrainosus, not intractable - Novel Coronavirus, NAA ([facility])  2. Diarrhea, unspecified type - Cdiff NAA+O+P+Stool Culture - Novel Coronavirus, NAA ([facility])   Rest Force fluids  Imodium as needed Labs pending Will rule out COVID and C Diff Needs to self isoloate until results Good hand hygiene with water and soap Call office if symptoms worsen or do not improve    I discussed the assessment and treatment plan with the patient. The patient was provided an opportunity to ask questions and all were answered. The patient agreed with the plan and demonstrated an understanding of the instructions.   The patient was advised to call back or seek an in-person evaluation if the symptoms worsen or if the condition fails to improve as anticipated.  The above assessment and management plan was discussed with the patient. The patient verbalized understanding of and has agreed to the management plan. Patient is aware to call the clinic if symptoms persist or worsen. Patient is aware when to return to the clinic for a follow-up visit. Patient educated on when it is appropriate to go to the emergency department.   Time call ended:  9:49 AM  I provided  16 minutes of non-face-to-face time during this encounter.    Evelina Dun, FNP

## 2019-06-02 ENCOUNTER — Other Ambulatory Visit: Payer: Self-pay

## 2019-06-02 ENCOUNTER — Other Ambulatory Visit: Payer: Medicaid Other

## 2019-06-02 DIAGNOSIS — R197 Diarrhea, unspecified: Secondary | ICD-10-CM | POA: Diagnosis not present

## 2019-06-03 ENCOUNTER — Other Ambulatory Visit: Payer: Self-pay | Admitting: Family

## 2019-06-03 MED ORDER — CETIRIZINE HCL 10 MG PO TABS: 10.0000 mg | ORAL_TABLET | Freq: Every day | ORAL | 1 refills | Status: DC

## 2019-06-03 NOTE — Telephone Encounter (Signed)
Pt aware [facility]ill [facility] to [facility]

## 2019-06-10 LAB — CDIFF NAA+O+P+STOOL CULTURE
E coli, Shiga toxin Assay: NEGATIVE
Toxigenic C. Difficile by PCR: NEGATIVE

## 2019-06-26 ENCOUNTER — Ambulatory Visit (INDEPENDENT_AMBULATORY_CARE_PROVIDER_SITE_OTHER): Payer: Medicaid Other | Admitting: Family Medicine

## 2019-06-26 ENCOUNTER — Encounter: Payer: Self-pay | Admitting: Family Medicine

## 2019-06-26 DIAGNOSIS — R350 Frequency of micturition: Secondary | ICD-10-CM

## 2019-06-26 DIAGNOSIS — R3915 Urgency of urination: Secondary | ICD-10-CM

## 2019-06-26 DIAGNOSIS — R300 Dysuria: Secondary | ICD-10-CM

## 2019-06-26 DIAGNOSIS — R399 Unspecified symptoms and signs involving the genitourinary system: Secondary | ICD-10-CM

## 2019-06-26 MED ORDER — SULFAMETHOXAZOLE-TRIMETHOPRIM 800-160 MG PO TABS: 1.0000 | ORAL_TABLET | Freq: Two times a day (BID) | ORAL | 0 refills | Status: AC

## 2019-06-26 NOTE — Progress Notes (Signed)
Virtual Visit via telephone Note Due to COVID-19 pandemic this visit was conducted virtually. This visit type was conducted due to national recommendations for restrictions regarding the COVID-19 Pandemic (e.g. social distancing, sheltering in place) in an effort to limit this patient's exposure and mitigate transmission in our community. All issues noted in this document were discussed and addressed.  A physical exam was not performed with this format.   I connected with Kristina Ramirez on 06/26/19 at 0750 by telephone and verified that I am speaking with the correct person using two identifiers. [name] is currently located at home and family is currently with them during visit. The provider, Monia Pouch, FNP is located in their office at time of visit.  I discussed the limitations, risks, security and privacy concerns of performing an evaluation and management service by telephone and the availability of in person appointments. I also discussed with the patient that there may be a patient responsible charge related to this service. The patient expressed understanding and agreed to proceed.  Subjective:  Patient ID: Kristina Ramirez, female    DOB: 1966-10-06, 53 y.o.   MRN: [id number]  Chief Complaint:  Urinary Tract Infection   HPI: Ashely Ramirez is a 53 y.o. female presenting on 06/26/2019 for Urinary Tract Infection   Pt reports dysuria, urgency, and frequency for 3-4 days.   Urinary Tract Infection  This is a new problem. The current episode started in the past 7 days. The problem occurs every urination. The problem has been gradually worsening. The quality of the pain is described as burning and aching. The pain is at a severity of 5/10. The pain is moderate. There has been no fever. She is not sexually active. There is no history of pyelonephritis. Associated symptoms include frequency and urgency. Pertinent negatives include no chills, discharge, flank pain, hematuria, hesitancy, nausea,  possible pregnancy, sweats or vomiting. She has tried increased fluids (AZO) for the symptoms. The treatment provided no relief.     Relevant past medical, surgical, family, and social history reviewed and updated as indicated.  Allergies and medications reviewed and updated.   History reviewed. No pertinent past medical history.  Past Surgical History:  Procedure Laterality Date  . CORONARY ARTERY BYPASS GRAFT    . TUBAL LIGATION      Social History   Socioeconomic History  . Marital status: Single    Spouse name: Not on file  . Number of children: Not on file  . Years of education: Not on file  . Highest education level: Not on file  Occupational History  . Not on file  Social Needs  . Financial resource strain: Not on file  . Food insecurity    Worry: Not on file    Inability: Not on file  . Transportation needs    Medical: Not on file    Non-medical: Not on file  Tobacco Use  . Smoking status: Former Smoker    Quit date: 12/05/2013    Years since quitting: 5.5  . Smokeless tobacco: Never Used  Substance and Sexual Activity  . Alcohol use: Never    Frequency: Never  . Drug use: Never  . Sexual activity: Yes  Lifestyle  . Physical activity    Days per week: Not on file    Minutes per session: Not on file  . Stress: Not on file  Relationships  . Social Herbalist on phone: Not on file    Gets together: Not on file  Attends religious service: Not on file    Active member of club or organization: Not on file    Attends meetings of clubs or organizations: Not on file    Relationship status: Not on file  . Intimate partner violence    Fear of current or ex partner: Not on file    Emotionally abused: Not on file    Physically abused: Not on file    Forced sexual activity: Not on file  Other Topics Concern  . Not on file  Social History Narrative  . Not on file    Outpatient Encounter Medications as of 06/26/2019  Medication Sig  . aspirin EC  81 MG tablet Take 1 tablet (81 mg total) by mouth daily.  Marland Kitchen. atorvastatin (LIPITOR) 80 MG tablet Take 1 tablet (80 mg total) by mouth at bedtime.  Marland Kitchen. buPROPion (WELLBUTRIN XL) 300 MG 24 hr tablet Take 1 tablet (300 mg total) by mouth daily.  . cetirizine (ZYRTEC) 10 MG tablet Take 1 tablet (10 mg total) by mouth daily.  . furosemide (LASIX) 20 MG tablet Take 1 tablet (20 mg total) by mouth daily.  Marland Kitchen. gabapentin (NEURONTIN) 800 MG tablet TAKE 1 TABLET BY MOUTH THREE TIMES A DAY  . metoprolol tartrate (LOPRESSOR) 25 MG tablet Take 1 tablet (25 mg total) by mouth 2 (two) times daily.  . naproxen (NAPROSYN) 500 MG tablet Take 500 mg by mouth daily.  . sertraline (ZOLOFT) 100 MG tablet Take 1.5 tablets (150 mg total) by mouth daily.  Marland Kitchen. sulfamethoxazole-trimethoprim (BACTRIM DS) 800-160 MG tablet Take 1 tablet by mouth 2 (two) times daily for 5 days.  Marland Kitchen. topiramate (TOPAMAX) 100 MG tablet Take 1 tablet (100 mg total) by mouth 2 (two) times daily.  . traZODone (DESYREL) 50 MG tablet Take 1-2 tablets (50-100 mg total) by mouth at bedtime as needed for sleep.   No facility-administered encounter medications on file as of 06/26/2019.     No Known Allergies  Review of Systems  Constitutional: Negative for activity change, appetite change, chills, diaphoresis, fatigue, fever and unexpected weight change.  Respiratory: Negative for cough and shortness of breath.   Cardiovascular: Negative for chest pain, palpitations and leg swelling.  Gastrointestinal: Negative for abdominal pain, nausea and vomiting.  Genitourinary: Positive for dysuria, frequency and urgency. Negative for decreased urine volume, difficulty urinating, dyspareunia, enuresis, flank pain, genital sores, hematuria, hesitancy, pelvic pain, vaginal bleeding, vaginal discharge and vaginal pain.  Musculoskeletal: Negative for arthralgias, back pain and myalgias.  Skin: Negative for color change and pallor.  Neurological: Negative for dizziness,  syncope, weakness, light-headedness and headaches.  Psychiatric/Behavioral: Negative for confusion.  All other systems reviewed and are negative.        Observations/Objective: No vital signs or physical exam, this was a telephone or virtual health encounter.  Pt alert and oriented, answers all questions appropriately, and able to speak in full sentences.    Assessment and Plan: Lupita LeashDonna was seen today for urinary tract infection.  Diagnoses and all orders for this visit:  UTI symptoms Dysuria Frequency of urination Urgency of urination Reported symptoms consistent with acute UTI. Symptomatic care at home unsuccessful. No red flags concerning for pyelonephritis, PID, or malignancy. Due to ongoing and worsening symptoms, will initiate below. Increase water intake and avoid bladder irritants such as caffeine. No previous urine cultures available for review. Reevaluation in 2 weeks. Report any new or worsening symptoms.  -     sulfamethoxazole-trimethoprim (BACTRIM DS) 800-160 MG tablet; Take 1  tablet by mouth 2 (two) times daily for 5 days.     Follow Up Instructions: Return in about 2 weeks (around 07/10/2019) for urinalysis.    I discussed the assessment and treatment plan with the patient. The patient was provided an opportunity to ask questions and all were answered. The patient agreed with the plan and demonstrated an understanding of the instructions.   The patient was advised to call back or seek an in-person evaluation if the symptoms worsen or if the condition fails to improve as anticipated.  The above assessment and management plan was discussed with the patient. The patient verbalized understanding of and has agreed to the management plan. Patient is aware to call the clinic if symptoms persist or worsen. Patient is aware when to return to the clinic for a follow-up visit. Patient educated on when it is appropriate to go to the emergency department.    I provided 15  minutes of non-face-to-face time during this encounter. The call started at 0750. The call ended at 0805. The other time was used for coordination of care.    Kari BaarsMichelle Brallan Denio, FNP-C [facility] [address] [place], [place] [id number] [phone] 06/26/19

## 2019-07-22 ENCOUNTER — Other Ambulatory Visit: Payer: Self-pay | Admitting: Family

## 2019-07-22 DIAGNOSIS — I10 Essential (primary) hypertension: Secondary | ICD-10-CM

## 2019-08-23 ENCOUNTER — Other Ambulatory Visit: Payer: Self-pay | Admitting: Family

## 2019-08-23 DIAGNOSIS — F411 Generalized anxiety disorder: Secondary | ICD-10-CM

## 2019-08-23 DIAGNOSIS — F321 Major depressive disorder, single episode, moderate: Secondary | ICD-10-CM

## 2019-08-29 ENCOUNTER — Other Ambulatory Visit: Payer: Self-pay | Admitting: Family

## 2019-10-26 ENCOUNTER — Other Ambulatory Visit: Payer: Self-pay | Admitting: Family

## 2019-11-10 ENCOUNTER — Other Ambulatory Visit: Payer: Self-pay | Admitting: Family

## 2019-11-10 DIAGNOSIS — F321 Major depressive disorder, single episode, moderate: Secondary | ICD-10-CM

## 2019-11-10 DIAGNOSIS — F411 Generalized anxiety disorder: Secondary | ICD-10-CM

## 2019-11-17 ENCOUNTER — Other Ambulatory Visit: Payer: Self-pay | Admitting: Family

## 2019-11-17 ENCOUNTER — Other Ambulatory Visit: Payer: Self-pay | Admitting: Family Medicine

## 2019-11-17 DIAGNOSIS — I10 Essential (primary) hypertension: Secondary | ICD-10-CM

## 2019-11-17 DIAGNOSIS — F411 Generalized anxiety disorder: Secondary | ICD-10-CM

## 2019-11-17 DIAGNOSIS — R300 Dysuria: Secondary | ICD-10-CM

## 2019-11-17 DIAGNOSIS — R350 Frequency of micturition: Secondary | ICD-10-CM

## 2019-11-17 DIAGNOSIS — R399 Unspecified symptoms and signs involving the genitourinary system: Secondary | ICD-10-CM

## 2019-11-17 DIAGNOSIS — F321 Major depressive disorder, single episode, moderate: Secondary | ICD-10-CM

## 2019-11-17 DIAGNOSIS — R3915 Urgency of urination: Secondary | ICD-10-CM

## 2019-11-17 DIAGNOSIS — I252 Old myocardial infarction: Secondary | ICD-10-CM

## 2019-11-19 ENCOUNTER — Other Ambulatory Visit: Payer: Self-pay | Admitting: Family

## 2019-11-19 DIAGNOSIS — I10 Essential (primary) hypertension: Secondary | ICD-10-CM

## 2019-11-25 ENCOUNTER — Telehealth: Payer: Self-pay | Admitting: *Deleted

## 2019-11-25 NOTE — Telephone Encounter (Signed)
Patient verbally consented for tele-health visits with [facility] and understands that her insurance company will be billed for the encounter.  Aware to have vitals available   

## 2019-12-03 ENCOUNTER — Telehealth (INDEPENDENT_AMBULATORY_CARE_PROVIDER_SITE_OTHER): Payer: Medicaid Other | Admitting: Cardiovascular Disease

## 2019-12-03 ENCOUNTER — Encounter: Payer: Self-pay | Admitting: *Deleted

## 2019-12-03 VITALS — BP 132/85 | HR 66 | Ht 63.0 in | Wt 177.0 lb

## 2019-12-03 DIAGNOSIS — I10 Essential (primary) hypertension: Secondary | ICD-10-CM

## 2019-12-03 DIAGNOSIS — E782 Mixed hyperlipidemia: Secondary | ICD-10-CM

## 2019-12-03 DIAGNOSIS — I25708 Atherosclerosis of coronary artery bypass graft(s), unspecified, with other forms of angina pectoris: Secondary | ICD-10-CM

## 2019-12-03 DIAGNOSIS — R079 Chest pain, unspecified: Secondary | ICD-10-CM | POA: Diagnosis not present

## 2019-12-03 MED ORDER — NITROGLYCERIN 0.4 MG SL SUBL: 0.4000 mg | SUBLINGUAL_TABLET | SUBLINGUAL | 3 refills | Status: DC | PRN

## 2019-12-03 MED ORDER — ASPIRIN EC 81 MG PO TBEC: 81.0000 mg | DELAYED_RELEASE_TABLET | Freq: Every day | ORAL | 3 refills | Status: AC

## 2019-12-03 NOTE — Progress Notes (Signed)
Virtual Visit via Telephone Note   This visit type was conducted due to national recommendations for restrictions regarding the COVID-19 Pandemic (e.g. social distancing) in an effort to limit this patient's exposure and mitigate transmission in our community.  Due to her co-morbid illnesses, this patient is at least at moderate risk for complications without adequate follow up.  This format is felt to be most appropriate for this patient at this time.  The patient did not have access to video technology/had technical difficulties with video requiring transitioning to audio format only (telephone).  All issues noted in this document were discussed and addressed.  No physical exam could be performed with this format.  Please refer to the patient's chart for her  consent to telehealth for [facility].   Date:  12/03/2019   ID:  Kristina Ramirez, DOB 31-Mar-1966, MRN [id number]  Patient Location: Home Provider Location: Office  PCP:  Junie Spencer, FNP  Cardiologist:  Prentice Docker, MD  Electrophysiologist:  None   Evaluation Performed:  Follow-Up Visit  Chief Complaint:  CAD  History of Present Illness:    Kristina Ramirez is a 54 y.o. female with a history of coronary artery disease status post three-vessel CABG with LIMA to the LAD and D1 and SVG to the RCA.  There was residual nonobstructive disease in the left circumflex.  She also has chronic diastolic heart failure. She also has a history of panic attacks dating back to 2012.   She has had some short-term memory loss ever since CABG.    She had some sharp pains in the center of her chest yesterday. She said she never felt these before. They were "very sharp". There was mild shortness of breath associated with this. They were similar prior to anginal pains. She was lying in bed at the time and symptoms lasted 15-20 minutes. She doesn't have any nitro.  She has not been taking ASA.  When she awoke this morning she felt like the muscle  near her shoulder blade was pulled.  She was lifting a heavy trash can prior to this.  She lives with her 2 kids. She is no longer engaged.  She has no transportation.   Past Medical History:  Diagnosis Date  . CAD (coronary artery disease)    status post three-vessel CABG with LIMA to the LAD and D1 and SVG to the RCA.  There was residual nonobstructive disease in the left circumflex  . Chronic diastolic heart failure (HCC)   . Hypertension   . Mixed dyslipidemia    Past Surgical History:  Procedure Laterality Date  . CARDIAC CATHETERIZATION  01/21/2015  . CORONARY ARTERY BYPASS GRAFT    . TUBAL LIGATION       Current Meds  Medication Sig  . atorvastatin (LIPITOR) 80 MG tablet Take 1 tablet (80 mg total) by mouth at bedtime.  Marland Kitchen buPROPion (WELLBUTRIN XL) 300 MG 24 hr tablet TAKE 1 TABLET (300 MG TOTAL) BY MOUTH DAILY. (NEEDS TO BE SEEN BEFORE NEXT REFILL)  . gabapentin (NEURONTIN) 800 MG tablet Take 1 tablet (800 mg total) by mouth 3 (three) times daily. (Needs to be seen before next refill)  . metoprolol tartrate (LOPRESSOR) 25 MG tablet Take 1 tablet (25 mg total) by mouth 2 (two) times daily. (Needs to be seen before next refill)  . naproxen (NAPROSYN) 500 MG tablet Take 500 mg by mouth daily.  . sertraline (ZOLOFT) 100 MG tablet Take 1.5 tablets (150 mg total) by mouth daily. (  Needs to be seen before next refill)  . topiramate (TOPAMAX) 100 MG tablet Take 1 tablet (100 mg total) by mouth 2 (two) times daily.  . traZODone (DESYREL) 50 MG tablet Take 1-2 tablets (50-100 mg total) by mouth at bedtime as needed for sleep.  . [DISCONTINUED] aspirin EC 81 MG tablet Take 1 tablet (81 mg total) by mouth daily.  . [DISCONTINUED] cetirizine (ZYRTEC) 10 MG tablet Take 1 tablet (10 mg total) by mouth daily.  . [DISCONTINUED] furosemide (LASIX) 20 MG tablet Take 1 tablet (20 mg total) by mouth daily. (Needs to be seen before next refill)     Allergies:   Patient has no known allergies.     Social History   Tobacco Use  . Smoking status: Former Smoker    Quit date: 12/05/2013    Years since quitting: 5.9  . Smokeless tobacco: Never Used  Substance Use Topics  . Alcohol use: Never  . Drug use: Never     Family Hx: The patient's family history includes CAD in her father.  ROS:   Please see the history of present illness.     All other systems reviewed and are negative.   Prior CV studies:   The following studies were reviewed today:  NA  Labs/Other Tests and Data Reviewed:    EKG:  No ECG reviewed.  Recent Labs: No results found for requested labs within last 8760 hours.   Recent Lipid Panel Lab Results  Component Value Date/Time   CHOL 238 (H) 10/21/2018 10:35 AM   TRIG 423 (H) 10/21/2018 10:35 AM   HDL 44 10/21/2018 10:35 AM   CHOLHDL 5.4 (H) 10/21/2018 10:35 AM   LDLCALC Comment 10/21/2018 10:35 AM    Wt Readings from Last 3 Encounters:  12/03/19 177 lb (80.3 kg)  11/04/18 162 lb (73.5 kg)  10/21/18 164 lb 3.2 oz (74.5 kg)     Objective:    Vital Signs:  BP 132/85   Pulse 66   Ht 5\' 3"  (1.6 m)   Wt 177 lb (80.3 kg)   BMI 31.35 kg/m    VITAL SIGNS:  reviewed  ASSESSMENT & PLAN:    1.  Coronary artery disease: Status post three-vessel CABG in 2016.  She had sharp chest pains yesterday at rest, similar to prior anginal pains. Currently on atorvastatin and Toprol-XL.  I again encouraged her to begin taking aspirin 81 mg daily. I will prescribe SL nitro.  2.  Hypertension: Blood pressure is normal.  No changes to therapy.  3.  Chronic diastolic heart failure: Taking Lasix 20 mg daily.  4.  Mixed dyslipidemia: Currently on atorvastatin 80 mg daily.  Goal LDL less than 70. I will check lipids.    COVID-19 Education: The signs and symptoms of COVID-19 were discussed with the patient and how to seek care for testing (follow up with PCP or arrange E-visit).  The importance of social distancing was discussed today.  Time:   Today,  I have spent 25 minutes with the patient with telehealth technology discussing the above problems.     Medication Adjustments/Labs and Tests Ordered: Current medicines are reviewed at length with the patient today.  Concerns regarding medicines are outlined above.   Tests Ordered: No orders of the defined types were placed in this encounter.   Medication Changes: No orders of the defined types were placed in this encounter.   Follow Up:  Virtual Visit  in 6 week(s)  Signed, Kate Sable, MD  12/03/2019  2:31 PM    [facility]

## 2019-12-03 NOTE — Patient Instructions (Addendum)
Medication Instructions:   Your physician has recommended you make the following change in your medication:   Start aspirin 81 mg by mouth daily  Nitroglycerin prescription sent to your pharmacy: Dissolve one under tongue for chest pain every 5 minutes up to 3 doses. If no relief, proceed to ED.  Continue other medications the same  Labwork: Your physician recommends that you return for a FASTING lipid profile: as soon as possible. Please do not eat or drink for at least 8 hours when you have this done. You may take your medications that morning with a sip of water. You may have this done at [facility] ([employer])   Testing/Procedures:  NONE  Follow-Up:  Your physician recommends that you schedule a follow-up appointment in: 6 weeks (virtual).  Any Other Special Instructions Will Be Listed Below (If Applicable).  If you need a refill on your cardiac medications before your next appointment, please call your pharmacy.

## 2019-12-03 NOTE — Addendum Note (Signed)
Addended by: Eustace Moore on: 12/03/2019 03:09 PM   Modules accepted: Orders

## 2019-12-04 ENCOUNTER — Other Ambulatory Visit: Payer: Self-pay | Admitting: Family

## 2019-12-04 NOTE — Telephone Encounter (Signed)
Refill request Zyrtec not on pt active med list Ok to refill?

## 2019-12-14 ENCOUNTER — Other Ambulatory Visit: Payer: Self-pay | Admitting: Family

## 2019-12-14 DIAGNOSIS — F321 Major depressive disorder, single episode, moderate: Secondary | ICD-10-CM

## 2019-12-14 DIAGNOSIS — F411 Generalized anxiety disorder: Secondary | ICD-10-CM

## 2019-12-14 DIAGNOSIS — I10 Essential (primary) hypertension: Secondary | ICD-10-CM

## 2019-12-14 DIAGNOSIS — I252 Old myocardial infarction: Secondary | ICD-10-CM

## 2020-01-16 ENCOUNTER — Telehealth (INDEPENDENT_AMBULATORY_CARE_PROVIDER_SITE_OTHER): Payer: Medicaid Other | Admitting: Cardiovascular Disease

## 2020-01-16 ENCOUNTER — Encounter: Payer: Self-pay | Admitting: Cardiovascular Disease

## 2020-01-16 VITALS — BP 116/74 | HR 75 | Ht 62.0 in | Wt 182.0 lb

## 2020-01-16 DIAGNOSIS — I25708 Atherosclerosis of coronary artery bypass graft(s), unspecified, with other forms of angina pectoris: Secondary | ICD-10-CM

## 2020-01-16 DIAGNOSIS — I5032 Chronic diastolic (congestive) heart failure: Secondary | ICD-10-CM

## 2020-01-16 DIAGNOSIS — I10 Essential (primary) hypertension: Secondary | ICD-10-CM

## 2020-01-16 DIAGNOSIS — E782 Mixed hyperlipidemia: Secondary | ICD-10-CM

## 2020-01-16 NOTE — Patient Instructions (Signed)
Medication Instructions:  Continue all current medications.  Labwork: none  Testing/Procedures: none  Follow-Up: 2 months   Any Other Special Instructions Will Be Listed Below (If Applicable).  If you need a refill on your cardiac medications before your next appointment, please call your pharmacy.  

## 2020-01-16 NOTE — Progress Notes (Signed)
Virtual Visit via Telephone Note   This visit type was conducted due to national recommendations for restrictions regarding the COVID-19 Pandemic (e.g. social distancing) in an effort to limit this patient's exposure and mitigate transmission in our community.  Due to her co-morbid illnesses, this patient is at least at moderate risk for complications without adequate follow up.  This format is felt to be most appropriate for this patient at this time.  The patient did not have access to video technology/had technical difficulties with video requiring transitioning to audio format only (telephone).  All issues noted in this document were discussed and addressed.  No physical exam could be performed with this format.  Please refer to the patient's chart for her  consent to telehealth for [facility].   The patient was identified using 2 identifiers.  Date:  01/16/2020   ID:  Kristina Ramirez, DOB Mar 17, 1966, MRN [id number]  Patient Location: Home Provider Location: Office  PCP:  Sharion Balloon, FNP  Cardiologist:  Kate Sable, MD  Electrophysiologist:  None   Evaluation Performed:  Follow-Up Visit  Chief Complaint:  CAD  History of Present Illness:    Kristina Ramirez is a 54 y.o. female with a history of coronary artery disease status post three-vessel CABG with LIMA to the LAD and D1 and SVG to the RCA. There was residual nonobstructive disease in the left circumflex. She also has chronic diastolic heart failure. She also has a history of panic attacks dating back to 2012.   She has had some short-term memory loss ever since CABG.   She is having problems with her boyfriend.  She told me how she went to the beach last week and choked on a piece of steak at a [employer]. She was tearful when describing it.  She lives with her 2 kids.   Her daughter, Kristina Ramirez, participated in the conversation.  The patient has had one episode of chest pain lasting seconds since her last  visit with me for which she took 1 sublingual nitroglycerin.  She has not had any changes in baseline exertional dyspnea.  She has had some mild ankle swelling from time to time.  She has had some right sided chest wall tenderness in the right axillary region.  She did not get her lipids checked.  She does have a history of panic attacks dating back to 2012.   Past Medical History:  Diagnosis Date  . CAD (coronary artery disease)    status post three-vessel CABG with LIMA to the LAD and D1 and SVG to the RCA.  There was residual nonobstructive disease in the left circumflex  . Chronic diastolic heart failure ([place])   . Hypertension   . Mixed dyslipidemia    Past Surgical History:  Procedure Laterality Date  . CARDIAC CATHETERIZATION  01/21/2015  . CORONARY ARTERY BYPASS GRAFT    . TUBAL LIGATION       Current Meds  Medication Sig  . aspirin EC 81 MG tablet Take 1 tablet (81 mg total) by mouth daily.  Marland Kitchen atorvastatin (LIPITOR) 80 MG tablet Take 1 tablet (80 mg total) by mouth at bedtime.  Marland Kitchen buPROPion (WELLBUTRIN XL) 300 MG 24 hr tablet TAKE 1 TABLET (300 MG TOTAL) BY MOUTH DAILY. (NEEDS TO BE SEEN BEFORE NEXT REFILL)  . cetirizine (ZYRTEC) 10 MG tablet TAKE 1 TABLET BY MOUTH EVERY DAY  . furosemide (LASIX) 20 MG tablet TAKE 1 TABLET (20 MG TOTAL) BY MOUTH DAILY. (NEEDS TO BE SEEN  BEFORE NEXT REFILL)  . gabapentin (NEURONTIN) 800 MG tablet TAKE 1 TABLET (800 MG TOTAL) BY MOUTH 3 (THREE) TIMES DAILY. (NEEDS TO BE SEEN BEFORE NEXT REFILL)  . metoprolol tartrate (LOPRESSOR) 25 MG tablet Take 1 tablet (25 mg total) by mouth 2 (two) times daily. (Needs to be seen before next refill)  . nitroGLYCERIN (NITROSTAT) 0.4 MG SL tablet Place 1 tablet (0.4 mg total) under the tongue every 5 (five) minutes x 3 doses as needed for chest pain (if no relief after 3rd dose, proceed to the ED for an evaluation or call 911).  . sertraline (ZOLOFT) 100 MG tablet TAKE 1.5 TABLETS (150 MG TOTAL) BY MOUTH  DAILY. (NEEDS TO BE SEEN BEFORE NEXT REFILL)  . topiramate (TOPAMAX) 100 MG tablet Take 1 tablet (100 mg total) by mouth 2 (two) times daily.  . traZODone (DESYREL) 50 MG tablet Take 1-2 tablets (50-100 mg total) by mouth at bedtime as needed for sleep.     Allergies:   Patient has no known allergies.   Social History   Tobacco Use  . Smoking status: Former Smoker    Quit date: 12/05/2013    Years since quitting: 6.1  . Smokeless tobacco: Never Used  Substance Use Topics  . Alcohol use: Never  . Drug use: Never     Family Hx: The patient's family history includes CAD in her father.  ROS:   Please see the history of present illness.     All other systems reviewed and are negative.   Prior CV studies:   The following studies were reviewed today:  NA  Labs/Other Tests and Data Reviewed:    EKG:  No ECG reviewed.  Recent Labs: No results found for requested labs within last 8760 hours.   Recent Lipid Panel Lab Results  Component Value Date/Time   CHOL 238 (H) 10/21/2018 10:35 AM   TRIG 423 (H) 10/21/2018 10:35 AM   HDL 44 10/21/2018 10:35 AM   CHOLHDL 5.4 (H) 10/21/2018 10:35 AM   LDLCALC Comment 10/21/2018 10:35 AM    Wt Readings from Last 3 Encounters:  01/16/20 182 lb (82.6 kg)  12/03/19 177 lb (80.3 kg)  11/04/18 162 lb (73.5 kg)     Objective:    Vital Signs:  BP 116/74   Pulse 75   Ht 5\' 2"  (1.575 m)   Wt 182 lb (82.6 kg)   BMI 33.29 kg/m    VITAL SIGNS:  reviewed  ASSESSMENT & PLAN:    1. Coronary artery disease: Status post three-vessel CABG in 2016.  Continue aspirin 81 mg, atorvastatin and Toprol-XL. She is use nitroglycerin once since her last evaluation.  2. Hypertension: Blood pressure is normal.  She does say her blood pressure has been fluctuating lately. No changes to therapy.  3. Chronic diastolic heart failure:  Continue Lasix 20 mg daily.  4. Mixed dyslipidemia: Currently on atorvastatin 80 mg daily.  Goal LDL less  than 70. I will check lipids.    COVID-19 Education: The signs and symptoms of COVID-19 were discussed with the patient and how to seek care for testing (follow up with PCP or arrange E-visit).  25The importance of social distancing was discussed today.  Time:   Today, I have spent 25 minutes with the patient with telehealth technology discussing the above problems.     Medication Adjustments/Labs and Tests Ordered: Current medicines are reviewed at length with the patient today.  Concerns regarding medicines are outlined above.   Tests Ordered:  No orders of the defined types were placed in this encounter.   Medication Changes: No orders of the defined types were placed in this encounter.   Follow Up:  Virtual Visit  in 2 month(s)  Signed, Prentice Docker, MD  01/16/2020 11:56 AM    [facility]

## 2020-01-19 ENCOUNTER — Other Ambulatory Visit: Payer: Self-pay | Admitting: Family

## 2020-01-19 DIAGNOSIS — I10 Essential (primary) hypertension: Secondary | ICD-10-CM

## 2020-01-19 DIAGNOSIS — I252 Old myocardial infarction: Secondary | ICD-10-CM

## 2020-01-19 DIAGNOSIS — F411 Generalized anxiety disorder: Secondary | ICD-10-CM

## 2020-01-19 DIAGNOSIS — F321 Major depressive disorder, single episode, moderate: Secondary | ICD-10-CM

## 2020-01-20 NOTE — Telephone Encounter (Signed)
Patient aware that medications refused.  Appointment scheduled with Jannifer Rodney on 01/22/2020.

## 2020-01-20 NOTE — Telephone Encounter (Signed)
[employer] NTBS 30 days given 12/15/19

## 2020-01-22 ENCOUNTER — Ambulatory Visit: Payer: Medicaid Other | Admitting: Family

## 2020-01-27 ENCOUNTER — Encounter: Payer: Self-pay | Admitting: Family

## 2020-02-14 ENCOUNTER — Other Ambulatory Visit: Payer: Self-pay | Admitting: Family

## 2020-02-14 DIAGNOSIS — E785 Hyperlipidemia, unspecified: Secondary | ICD-10-CM

## 2020-02-23 ENCOUNTER — Telehealth: Payer: Self-pay | Admitting: Family

## 2020-02-23 DIAGNOSIS — F411 Generalized anxiety disorder: Secondary | ICD-10-CM

## 2020-02-23 DIAGNOSIS — E785 Hyperlipidemia, unspecified: Secondary | ICD-10-CM

## 2020-02-23 DIAGNOSIS — F321 Major depressive disorder, single episode, moderate: Secondary | ICD-10-CM

## 2020-02-23 DIAGNOSIS — G43109 Migraine with aura, not intractable, without status migrainosus: Secondary | ICD-10-CM

## 2020-02-23 DIAGNOSIS — I10 Essential (primary) hypertension: Secondary | ICD-10-CM

## 2020-02-23 DIAGNOSIS — I252 Old myocardial infarction: Secondary | ICD-10-CM

## 2020-02-23 NOTE — Telephone Encounter (Signed)
  Prescription Request  02/23/2020  What is the name of the medication or equipment? furosemide (LASIX) 20 MG tablet  sertraline (ZOLOFT) 100 MG tablet gabapentin (NEURONTIN) 800 MG tablet buPROPion (WELLBUTRIN XL) 300 MG 24 hr tablet  cetirizine (ZYRTEC) 10 MG tablet  atorvastatin (LIPITOR) 80 MG tablet metoprolol tartrate (LOPRESSOR) 25 MG tablet   Have you contacted your pharmacy to request a refill? (if applicable) Yes  Which pharmacy would you like this sent to? [facility] pt has appt with Neysa Bonito on 03-01-20 needs refill before appt she will run out    Patient notified that their request is being sent to the clinical staff for review and that they should receive a response within 2 business days.

## 2020-02-24 MED ORDER — ATORVASTATIN CALCIUM 80 MG PO TABS: 80.0000 mg | ORAL_TABLET | Freq: Every day | ORAL | 6 refills | Status: DC

## 2020-02-24 MED ORDER — TOPIRAMATE 100 MG PO TABS: 100.0000 mg | ORAL_TABLET | Freq: Two times a day (BID) | ORAL | 1 refills | Status: DC

## 2020-02-24 MED ORDER — SERTRALINE HCL 100 MG PO TABS: 150.0000 mg | ORAL_TABLET | Freq: Every day | ORAL | 0 refills | Status: DC

## 2020-02-24 MED ORDER — FUROSEMIDE 20 MG PO TABS: 20.0000 mg | ORAL_TABLET | Freq: Every day | ORAL | 0 refills | Status: DC

## 2020-02-24 MED ORDER — TRAZODONE HCL 50 MG PO TABS: 50.0000 mg | ORAL_TABLET | Freq: Every evening | ORAL | 3 refills | Status: DC | PRN

## 2020-02-24 MED ORDER — METOPROLOL TARTRATE 25 MG PO TABS: 25.0000 mg | ORAL_TABLET | Freq: Two times a day (BID) | ORAL | 0 refills | Status: DC

## 2020-02-24 MED ORDER — GABAPENTIN 800 MG PO TABS: 800.0000 mg | ORAL_TABLET | Freq: Three times a day (TID) | ORAL | 0 refills | Status: DC

## 2020-02-24 MED ORDER — BUPROPION HCL ER (XL) 300 MG PO TB24: 300.0000 mg | ORAL_TABLET | Freq: Every day | ORAL | 0 refills | Status: DC

## 2020-02-24 NOTE — Telephone Encounter (Signed)
Prescription sent to pharmacy.

## 2020-03-01 ENCOUNTER — Ambulatory Visit: Payer: Medicaid Other | Admitting: Family

## 2020-03-01 ENCOUNTER — Encounter: Payer: Self-pay | Admitting: Family

## 2020-03-24 ENCOUNTER — Other Ambulatory Visit: Payer: Self-pay | Admitting: Family

## 2020-03-24 ENCOUNTER — Telehealth: Payer: Self-pay | Admitting: Cardiovascular Disease

## 2020-03-24 ENCOUNTER — Telehealth: Payer: Medicaid Other | Admitting: Cardiovascular Disease

## 2020-03-24 DIAGNOSIS — F411 Generalized anxiety disorder: Secondary | ICD-10-CM

## 2020-03-24 DIAGNOSIS — F321 Major depressive disorder, single episode, moderate: Secondary | ICD-10-CM

## 2020-03-24 NOTE — Telephone Encounter (Signed)
°  Patient Consent for Virtual Visit         Kristina Ramirez has provided verbal consent on 03/24/2020 for a virtual visit (video or telephone).   CONSENT FOR VIRTUAL VISIT FOR:  Kristina Ramirez  By participating in this virtual visit I agree to the following:  I hereby voluntarily request, consent and authorize [facility] and its employed or contracted physicians, physician assistants, nurse practitioners or other licensed health care professionals (the Practitioner), to provide me with telemedicine health care services (the Services") as deemed necessary by the treating Practitioner. I acknowledge and consent to receive the Services by the Practitioner via telemedicine. I understand that the telemedicine visit will involve communicating with the Practitioner through live audiovisual communication technology and the disclosure of certain medical information by electronic transmission. I acknowledge that I have been given the opportunity to request an in-person assessment or other available alternative prior to the telemedicine visit and am voluntarily participating in the telemedicine visit.  I understand that I have the right to withhold or withdraw my consent to the use of telemedicine in the course of my care at any time, without affecting my right to future care or treatment, and that the Practitioner or I may terminate the telemedicine visit at any time. I understand that I have the right to inspect all information obtained and/or recorded in the course of the telemedicine visit and may receive copies of available information for a reasonable fee.  I understand that some of the potential risks of receiving the Services via telemedicine include:   Delay or interruption in medical evaluation due to technological equipment failure or disruption;  Information transmitted may not be sufficient (e.g. poor resolution of images) to allow for appropriate medical decision making by the Practitioner; and/or     In rare instances, security protocols could fail, causing a breach of personal health information.  Furthermore, I acknowledge that it is my responsibility to provide information about my medical history, conditions and care that is complete and accurate to the best of my ability. I acknowledge that Practitioner's advice, recommendations, and/or decision may be based on factors not within their control, such as incomplete or inaccurate data provided by me or distortions of diagnostic images or specimens that may result from electronic transmissions. I understand that the practice of medicine is not an exact science and that Practitioner makes no warranties or guarantees regarding treatment outcomes. I acknowledge that a copy of this consent can be made available to me via my patient portal [facility] MyChart), or I can request a printed copy by calling the office of [facility].    I understand that my insurance will be billed for this visit.   I have read or had this consent read to me.  I understand the contents of this consent, which adequately explains the benefits and risks of the Services being provided via telemedicine.   I have been provided ample opportunity to ask questions regarding this consent and the Services and have had my questions answered to my satisfaction.  I give my informed consent for the services to be provided through the use of telemedicine in my medical care

## 2020-03-24 NOTE — Telephone Encounter (Signed)
CALLED PATIENT, NO ANSWER, NO OPTION TO LEAVE MESSAGE 

## 2020-03-24 NOTE — Telephone Encounter (Signed)
[employer] NTBS 30 days given 02/24/20

## 2020-04-26 ENCOUNTER — Encounter: Payer: Self-pay | Admitting: Family

## 2020-04-26 ENCOUNTER — Other Ambulatory Visit: Payer: Self-pay

## 2020-04-26 ENCOUNTER — Ambulatory Visit (INDEPENDENT_AMBULATORY_CARE_PROVIDER_SITE_OTHER): Payer: Medicaid Other | Admitting: Family

## 2020-04-26 VITALS — BP 149/100 | HR 81 | Temp 97.1°F | Ht 62.0 in | Wt 189.6 lb

## 2020-04-26 DIAGNOSIS — E785 Hyperlipidemia, unspecified: Secondary | ICD-10-CM

## 2020-04-26 DIAGNOSIS — G43109 Migraine with aura, not intractable, without status migrainosus: Secondary | ICD-10-CM | POA: Diagnosis not present

## 2020-04-26 DIAGNOSIS — F321 Major depressive disorder, single episode, moderate: Secondary | ICD-10-CM | POA: Diagnosis not present

## 2020-04-26 DIAGNOSIS — F411 Generalized anxiety disorder: Secondary | ICD-10-CM

## 2020-04-26 DIAGNOSIS — I5032 Chronic diastolic (congestive) heart failure: Secondary | ICD-10-CM

## 2020-04-26 DIAGNOSIS — I10 Essential (primary) hypertension: Secondary | ICD-10-CM

## 2020-04-26 DIAGNOSIS — I252 Old myocardial infarction: Secondary | ICD-10-CM

## 2020-04-26 DIAGNOSIS — I2510 Atherosclerotic heart disease of native coronary artery without angina pectoris: Secondary | ICD-10-CM | POA: Diagnosis not present

## 2020-04-26 DIAGNOSIS — Z1211 Encounter for screening for malignant neoplasm of colon: Secondary | ICD-10-CM

## 2020-04-26 DIAGNOSIS — Z1159 Encounter for screening for other viral diseases: Secondary | ICD-10-CM

## 2020-04-26 DIAGNOSIS — E669 Obesity, unspecified: Secondary | ICD-10-CM

## 2020-04-26 MED ORDER — GABAPENTIN 800 MG PO TABS: 800.0000 mg | ORAL_TABLET | Freq: Three times a day (TID) | ORAL | 3 refills | Status: DC

## 2020-04-26 MED ORDER — TRAZODONE HCL 50 MG PO TABS: 50.0000 mg | ORAL_TABLET | Freq: Every evening | ORAL | 3 refills | Status: DC | PRN

## 2020-04-26 MED ORDER — SERTRALINE HCL 100 MG PO TABS: 150.0000 mg | ORAL_TABLET | Freq: Every day | ORAL | 5 refills | Status: DC

## 2020-04-26 MED ORDER — BUPROPION HCL ER (XL) 300 MG PO TB24: 300.0000 mg | ORAL_TABLET | Freq: Every day | ORAL | 0 refills | Status: DC

## 2020-04-26 MED ORDER — ATORVASTATIN CALCIUM 80 MG PO TABS: 80.0000 mg | ORAL_TABLET | Freq: Every day | ORAL | 6 refills | Status: DC

## 2020-04-26 MED ORDER — TOPIRAMATE 100 MG PO TABS: 100.0000 mg | ORAL_TABLET | Freq: Two times a day (BID) | ORAL | 1 refills | Status: DC

## 2020-04-26 MED ORDER — METOPROLOL TARTRATE 25 MG PO TABS: 25.0000 mg | ORAL_TABLET | Freq: Two times a day (BID) | ORAL | 0 refills | Status: DC

## 2020-04-26 NOTE — Progress Notes (Signed)
Subjective:    Patient ID: Kristina Ramirez, female    DOB: 02-28-1966, 54 y.o.   MRN: [id number]  Chief Complaint  Patient presents with  . Medical Management of Chronic Issues    been out of medication for 2 weeks   . Shortness of Breath   Pt presents to the office today for chronic follow up. She reports she has been out of her medication for the last two weeks. She has hx of NSTEMI and CHF and followed by Cardiologists.  Shortness of Breath Associated symptoms include headaches.  Migraine  This is a chronic problem. The current episode started more than 1 year ago. The problem occurs daily. The problem has been unchanged. The pain is located in the left unilateral region. The pain quality is similar to prior headaches. The quality of the pain is described as aching. The pain is moderate. Associated symptoms include insomnia, nausea, phonophobia and photophobia. She has tried beta blockers for the symptoms. The treatment provided mild relief. Her past medical history is significant for hypertension, migraines in the family and obesity.  Hypertension This is a chronic problem. The current episode started more than 1 year ago. The problem has been waxing and waning since onset. The problem is uncontrolled. Associated symptoms include anxiety, headaches, malaise/fatigue, palpitations and shortness of breath. Pertinent negatives include no peripheral edema. Risk factors for coronary artery disease include dyslipidemia, obesity and sedentary lifestyle. Treatments tried: Has not had medicaiton in last two weeks. The current treatment provides no improvement. Hypertensive end-organ damage includes CAD/MI.  Congestive Heart Failure Presents for follow-up visit. Associated symptoms include palpitations and shortness of breath. The symptoms have been stable.  Hyperlipidemia This is a chronic problem. The current episode started more than 1 year ago. The problem is uncontrolled. Exacerbating diseases include  obesity. Associated symptoms include shortness of breath. She is currently on no antihyperlipidemic treatment. The current treatment provides no improvement of lipids. Risk factors for coronary artery disease include dyslipidemia, hypertension, a sedentary lifestyle and post-menopausal.  Depression        This is a chronic problem.  The current episode started more than 1 year ago.   The onset quality is sudden.   The problem occurs intermittently.  Associated symptoms include helplessness, hopelessness, insomnia, irritable, restlessness, headaches and sad.     The symptoms are aggravated by nothing.  Past medical history includes anxiety.   Anxiety Presents for follow-up visit. Symptoms include depressed mood, excessive worry, insomnia, irritability, nausea, palpitations, restlessness and shortness of breath.    Insomnia Primary symptoms: difficulty falling asleep, frequent awakening, malaise/fatigue.  The current episode started more than one year. The problem occurs intermittently. PMH includes: depression.      Review of Systems  Constitutional: Positive for irritability and malaise/fatigue.  Eyes: Positive for photophobia.  Respiratory: Positive for shortness of breath.   Cardiovascular: Positive for palpitations.  Gastrointestinal: Positive for nausea.  Neurological: Positive for headaches.  Psychiatric/Behavioral: Positive for depression. The patient has insomnia.   All other systems reviewed and are negative.      Objective:   Physical Exam Vitals reviewed.  Constitutional:      General: She is irritable. She is not in acute distress.    Appearance: She is well-developed. She is obese.  HENT:     Head: Normocephalic and atraumatic.     Right Ear: External ear normal.  Eyes:     Pupils: Pupils are equal, round, and reactive to light.  Neck:  Thyroid: No thyromegaly.  Cardiovascular:     Rate and Rhythm: Normal rate and regular rhythm.     Heart sounds: Normal heart  sounds. No murmur heard.   Pulmonary:     Effort: Pulmonary effort is normal. No respiratory distress.     Breath sounds: Normal breath sounds. No wheezing.  Abdominal:     General: Bowel sounds are normal. There is no distension.     Palpations: Abdomen is soft.     Tenderness: There is no abdominal tenderness.  Musculoskeletal:        General: No tenderness. Normal range of motion.     Cervical back: Normal range of motion and neck supple.  Skin:    General: Skin is warm and dry.  Neurological:     Mental Status: She is alert and oriented to person, place, and time.     Cranial Nerves: No cranial nerve deficit.     Deep Tendon Reflexes: Reflexes are normal and symmetric.  Psychiatric:        Behavior: Behavior normal.        Thought Content: Thought content normal.        Judgment: Judgment normal.       BP (!) 149/100   Pulse 81   Temp (!) 97.1 F (36.2 C) (Temporal)   Ht 5' 2"  (1.575 m)   Wt 189 lb 9.6 oz (86 kg)   SpO2 95%   BMI 34.68 kg/m      Assessment & Plan:  Kristina Ramirez comes in today with chief complaint of Medical Management of Chronic Issues (been out of medication for 2 weeks ) and Shortness of Breath   Diagnosis and orders addressed:  1. Atherosclerosis of native coronary artery without angina pectoris, unspecified whether native or transplanted heart - CMP14+EGFR - CBC with Differential/Platelet - Lipid panel  2. Essential (primary) hypertension - metoprolol tartrate (LOPRESSOR) 25 MG tablet; Take 1 tablet (25 mg total) by mouth 2 (two) times daily. (Needs to be seen before next refill)  Dispense: 60 tablet; Refill: 0 - CMP14+EGFR - CBC with Differential/Platelet  3. Chronic diastolic CHF (congestive heart failure) (HCC) - CMP14+EGFR - CBC with Differential/Platelet  4. Generalized anxiety disorder - buPROPion (WELLBUTRIN XL) 300 MG 24 hr tablet; Take 1 tablet (300 mg total) by mouth daily. (Needs to be seen before next refill)  Dispense: 30  tablet; Refill: 0 - sertraline (ZOLOFT) 100 MG tablet; Take 1.5 tablets (150 mg total) by mouth daily. (Needs to be seen before next refill)  Dispense: 45 tablet; Refill: 5 - CMP14+EGFR - CBC with Differential/Platelet  5. Depression, major, single episode, moderate (HCC) - buPROPion (WELLBUTRIN XL) 300 MG 24 hr tablet; Take 1 tablet (300 mg total) by mouth daily. (Needs to be seen before next refill)  Dispense: 30 tablet; Refill: 0 - sertraline (ZOLOFT) 100 MG tablet; Take 1.5 tablets (150 mg total) by mouth daily. (Needs to be seen before next refill)  Dispense: 45 tablet; Refill: 5 - CMP14+EGFR - CBC with Differential/Platelet  6. History of non-ST elevation myocardial infarction (NSTEMI) - CMP14+EGFR - CBC with Differential/Platelet  7. Hyperlipidemia, unspecified hyperlipidemia type - atorvastatin (LIPITOR) 80 MG tablet; Take 1 tablet (80 mg total) by mouth at bedtime.  Dispense: 30 tablet; Refill: 6 - CMP14+EGFR - CBC with Differential/Platelet - Lipid panel  8. Obesity (BMI 30-39.9) - CMP14+EGFR - CBC with Differential/Platelet  9. Migraine with aura and without status migrainosus, not intractable - topiramate (TOPAMAX) 100 MG tablet; Take 1  tablet (100 mg total) by mouth 2 (two) times daily.  Dispense: 180 tablet; Refill: 1 - Ambulatory referral to Neurology - CMP14+EGFR - CBC with Differential/Platelet  10. Need for hepatitis C screening test - CMP14+EGFR - CBC with Differential/Platelet - Hepatitis C antibody  11. Colon cancer screening - Cologuard   Labs pending Health Maintenance reviewed Diet and exercise encouraged  Follow up plan: 2 weeks to recheck HTN and for pap  Evelina Dun, FNP

## 2020-04-26 NOTE — Patient Instructions (Addendum)

## 2020-04-27 ENCOUNTER — Other Ambulatory Visit: Payer: Self-pay | Admitting: Family

## 2020-04-27 LAB — CBC WITH DIFFERENTIAL/PLATELET
Basophils Absolute: 0.1 10*3/uL (ref 0.0–0.2)
Basos: 1 %
EOS (ABSOLUTE): 0.4 10*3/uL (ref 0.0–0.4)
Eos: 4 %
Hematocrit: 41.2 % (ref 34.0–46.6)
Hemoglobin: 13.4 g/dL (ref 11.1–15.9)
Immature Grans (Abs): 0.1 10*3/uL (ref 0.0–0.1)
Immature Granulocytes: 1 %
Lymphocytes Absolute: 2.5 10*3/uL (ref 0.7–3.1)
Lymphs: 24 %
MCH: 27.7 pg (ref 26.6–33.0)
MCHC: 32.5 g/dL (ref 31.5–35.7)
MCV: 85 fL (ref 79–97)
Monocytes Absolute: 0.7 10*3/uL (ref 0.1–0.9)
Monocytes: 7 %
Neutrophils Absolute: 6.8 10*3/uL (ref 1.4–7.0)
Neutrophils: 63 %
Platelets: 326 10*3/uL (ref 150–450)
RBC: 4.83 x10E6/uL (ref 3.77–5.28)
RDW: 14.8 % (ref 11.7–15.4)
WBC: 10.5 10*3/uL (ref 3.4–10.8)

## 2020-04-27 LAB — LIPID PANEL
Chol/HDL Ratio: 7.6 ratio — ABNORMAL HIGH (ref 0.0–4.4)
Cholesterol, Total: 343 mg/dL — ABNORMAL HIGH (ref 100–199)
HDL: 45 mg/dL (ref >39)
LDL Chol Calc (NIH): 183 mg/dL — ABNORMAL HIGH (ref 0–99)
Triglycerides: 556 mg/dL (ref 0–149)
VLDL Cholesterol Cal: 115 mg/dL — ABNORMAL HIGH (ref 5–40)

## 2020-04-27 LAB — CMP14+EGFR
ALT: 25 IU/L (ref 0–32)
AST: 21 IU/L (ref 0–40)
Albumin/Globulin Ratio: 1.7 (ref 1.2–2.2)
Albumin: 4.8 g/dL (ref 3.8–4.9)
Alkaline Phosphatase: 121 IU/L (ref 48–121)
BUN/Creatinine Ratio: 11 (ref 9–23)
BUN: 11 mg/dL (ref 6–24)
Bilirubin Total: <0.2 mg/dL (ref 0.0–1.2)
CO2: 21 mmol/L (ref 20–29)
Calcium: 9.7 mg/dL (ref 8.7–10.2)
Chloride: 103 mmol/L (ref 96–106)
Creatinine, Ser: 0.99 mg/dL (ref 0.57–1.00)
GFR calc Af Amer: 75 mL/min/{1.73_m2} (ref >59)
GFR calc non Af Amer: 65 mL/min/{1.73_m2} (ref >59)
Globulin, Total: 2.8 g/dL (ref 1.5–4.5)
Glucose: 120 mg/dL — ABNORMAL HIGH (ref 65–99)
Potassium: 3.9 mmol/L (ref 3.5–5.2)
Sodium: 140 mmol/L (ref 134–144)
Total Protein: 7.6 g/dL (ref 6.0–8.5)

## 2020-04-27 LAB — HEPATITIS C ANTIBODY: Hep C Virus Ab: <0.1 s/co ratio (ref 0.0–0.9)

## 2020-04-30 LAB — HGB A1C W/O EAG: Hgb A1c MFr Bld: 5.9 % — ABNORMAL HIGH (ref 4.8–5.6)

## 2020-04-30 LAB — SPECIMEN STATUS REPORT

## 2020-05-07 ENCOUNTER — Encounter: Payer: Self-pay | Admitting: *Deleted

## 2020-05-28 ENCOUNTER — Ambulatory Visit: Payer: Medicaid Other | Admitting: Family

## 2020-05-30 ENCOUNTER — Other Ambulatory Visit: Payer: Self-pay | Admitting: Family

## 2020-05-30 DIAGNOSIS — F411 Generalized anxiety disorder: Secondary | ICD-10-CM

## 2020-05-30 DIAGNOSIS — F321 Major depressive disorder, single episode, moderate: Secondary | ICD-10-CM

## 2020-06-06 ENCOUNTER — Other Ambulatory Visit: Payer: Self-pay | Admitting: Family

## 2020-06-15 ENCOUNTER — Other Ambulatory Visit: Payer: Self-pay | Admitting: Family

## 2020-06-15 DIAGNOSIS — I10 Essential (primary) hypertension: Secondary | ICD-10-CM

## 2020-06-21 NOTE — Progress Notes (Signed)
Cardiology Office Note  Date: 06/22/2020   ID: Kristina Ramirez, DOB 1965/11/07, MRN [id number]  PCP:  Junie Spencer, FNP  Cardiologist:  No primary care provider on file. Electrophysiologist:  None   Chief Complaint: CABG  History of Present Illness: Kristina Ramirez is a 54 y.o. female with a history of CABG X 3 2016 (LIMA to the LAD and D1 and SVG to the RCA). HTN, HLD, Chronic HFpEF.   Last encounter with Dr Purvis Sheffield via telemedicine 01/16/2020. She had one episode of CP in the interval since previous visit and took 1 SL NTG. No changes in baseline exertional dyspnea. Some mild ankle swelling on occasion. Had some right sided chest wall tenderness in the right axillary area. Hx of panic attacks. Short term memory loss since CABG. She was to continue ASA 81 mg po daily, Toprol XL and atorvastatin. BP was normal. No changes to therapy. To continue Lasix for diastolic HF. Continue atorvastatin. Lipid panel was ordered.    She presents today with multiple complaints.  Complains of chest pain/chest tightness when walking short distances.  Complains of increased dyspnea on exertion and stating she cannot catch her breath when walking short distances.  States she has had "mini heart attacks" before.  She stated her blood pressure had been up but is normal today.  She denies any CVA/TIA like symptoms, PND or orthopnea,.  States she does have periodic palpitations which are short-lived and not associated with dizziness or lightheadedness.  No orthostatic symptoms per her statement.  No claudication-like symptoms, DVT or lower extremity edema.  Recently saw her PCP and lipid levels were significantly high.  Apparently at some point she had stopped her atorvastatin.  Atorvastatin 80 mg was restarted by PCP on 04/26/2020  Past Medical History:  Diagnosis Date  . CAD (coronary artery disease)    status post three-vessel CABG with LIMA to the LAD and D1 and SVG to the RCA.  There was residual nonobstructive  disease in the left circumflex  . Chronic diastolic heart failure (HCC)   . Hypertension   . Mixed dyslipidemia     Past Surgical History:  Procedure Laterality Date  . CARDIAC CATHETERIZATION  01/21/2015  . CORONARY ARTERY BYPASS GRAFT    . TUBAL LIGATION      Current Outpatient Medications  Medication Sig Dispense Refill  . aspirin EC 81 MG tablet Take 1 tablet (81 mg total) by mouth daily. 90 tablet 3  . atorvastatin (LIPITOR) 80 MG tablet Take 1 tablet (80 mg total) by mouth at bedtime. 30 tablet 6  . buPROPion (WELLBUTRIN XL) 300 MG 24 hr tablet Take 1 tablet (300 mg total) by mouth daily. 30 tablet 2  . cetirizine (ZYRTEC) 10 MG tablet TAKE 1 TABLET BY MOUTH EVERY DAY 90 tablet 1  . furosemide (LASIX) 20 MG tablet Take 1 tablet (20 mg total) by mouth daily. (Needs to be seen before next refill) 90 tablet 3  . gabapentin (NEURONTIN) 800 MG tablet Take 1 tablet (800 mg total) by mouth 3 (three) times daily. (Needs to be seen before next refill) 90 tablet 3  . metoprolol tartrate (LOPRESSOR) 25 MG tablet Take 1 tablet (25 mg total) by mouth 2 (two) times daily. 180 tablet 1  . nitroGLYCERIN (NITROSTAT) 0.4 MG SL tablet Place 1 tablet (0.4 mg total) under the tongue every 5 (five) minutes x 3 doses as needed for chest pain (if no relief after 3rd dose, proceed to the ED for an  evaluation or call 911). 25 tablet 3  . Olopatadine HCl 0.2 % SOLN Apply 1 drop to eye every morning.    . RESTASIS 0.05 % ophthalmic emulsion Place 1 drop into both eyes 2 (two) times daily.    . sertraline (ZOLOFT) 100 MG tablet Take 1.5 tablets (150 mg total) by mouth daily. (Needs to be seen before next refill) 45 tablet 5  . topiramate (TOPAMAX) 100 MG tablet Take 1 tablet (100 mg total) by mouth 2 (two) times daily. 180 tablet 1  . traZODone (DESYREL) 50 MG tablet Take 1-2 tablets (50-100 mg total) by mouth at bedtime as needed for sleep. 60 tablet 3  . isosorbide mononitrate (IMDUR) 30 MG 24 hr tablet Take  1 tablet (30 mg total) by mouth daily. 90 tablet 3   No current facility-administered medications for this visit.   Allergies:  Patient has no known allergies.   Social History: The patient  reports that she quit smoking about 6 years ago. She has never used smokeless tobacco. She reports that she does not drink alcohol and does not use drugs.   Family History: The patient's family history includes CAD in her father.   ROS:  Please see the history of present illness. Otherwise, complete review of systems is positive for none.  All other systems are reviewed and negative.   Physical Exam: VS:  BP 122/78   Pulse 72   Ht 5\' 2"  (1.575 m)   Wt 186 lb 14.4 oz (84.8 kg)   SpO2 99%   BMI 34.18 kg/m , BMI Body mass index is 34.18 kg/m.  Wt Readings from Last 3 Encounters:  06/22/20 186 lb 14.4 oz (84.8 kg)  04/26/20 189 lb 9.6 oz (86 kg)  01/16/20 182 lb (82.6 kg)    General: Patient appears comfortable at rest. Neck: Supple, no elevated JVP or carotid bruits, no thyromegaly. Lungs: Clear to auscultation, nonlabored breathing at rest. Cardiac: Regular rate and rhythm, no S3 or significant systolic murmur, no pericardial rub. Extremities: No pitting edema, distal pulses 2+. Skin: Warm and dry. Musculoskeletal: No kyphosis. Neuropsychiatric: Alert and oriented x3, affect grossly appropriate.  ECG:  An ECG dated 06/22/2020 was personally reviewed today and demonstrated:  Sinus rhythm rate of 74, nonspecific T wave abnormality.  Recent Labwork: 04/26/2020: ALT 25; AST 21; BUN 11; Creatinine, Ser 0.99; Hemoglobin 13.4; Platelets 326; Potassium 3.9; Sodium 140     Component Value Date/Time   CHOL 343 (H) 04/26/2020 1235   TRIG 556 (HH) 04/26/2020 1235   HDL 45 04/26/2020 1235   CHOLHDL 7.6 (H) 04/26/2020 1235   LDLCALC 183 (H) 04/26/2020 1235    Other Studies Reviewed Today:   Assessment and Plan:  1. CAD in native artery   2. Essential (primary) hypertension   3. Chronic  diastolic heart failure (HCC)   4. Mixed hyperlipidemia   5. Exertional chest pain   6. DOE (dyspnea on exertion)    1. CAD in native artery History of three-vessel bypass in 2016(LIMA to the LAD and D1 and SVG to the RCA).  Continue aspirin 81 mg daily, nitroglycerin 0.4 sublingual as needed.  Patient states she has had some recent increased chest pain with exertion.  States she had some radiation to her right neck.  Denies any nausea, vomiting, or diaphoresis associated.  Continue aspirin 81 mg, sublingual nitroglycerin as needed for chest pain.  We are adding Imdur 30 mg daily for chest pain.  Lexiscan Myoview ordered below.  2.  Essential (primary) hypertension Blood pressure is well controlled today with a blood pressure of 122/78.  Continue Lopressor 25 mg p.o. twice daily.  3. Chronic diastolic heart failure (HCC) Has a history of chronic diastolic heart failure.  She has not had a recent recorded echocardiogram with our system.  States she had echo at [facility] in the past.  States she ran out of her Lasix.  Please refill Lasix 20 mg daily.  4. Mixed hyperlipidemia Recent elevated lipids at PCP office.  Lipid panel on 04/26/2020 showed total cholesterol 343, triglycerides 556, HDL 45, VLDL 115, LDL 183, cholesterol/HDL ratio 7.6.  It appears she had stopped taking her atorvastatin.  Her PCP restarted the atorvastatin 80 mg on 04/26/2020.  5. Exertional chest pain Recently complaining of exertional chest pain radiating to right neck.  Denies any associated nausea, vomiting or diaphoresis.  History of three-vessel bypass in 2016.  Please get a Lexiscan Myoview stress test.  Start Imdur 30 mg daily.  6. DOE (dyspnea on exertion) Complaining of increased dyspnea on exertion.  She states when she has to walk from her apartment to the grocery store or any other exertional activity she has increasing dyspnea on exertion.  Please get an echocardiogram to assess LV function,  diastolic function, valvular function.   Medication Adjustments/Labs and Tests Ordered: Current medicines are reviewed at length with the patient today.  Concerns regarding medicines are outlined above.   Disposition: Follow-up with Dr. Diona Browner or APP and 6 to 8 weeks after echo and stress  Signed, Rennis Harding, NP 06/22/2020 12:19 PM    [facility] [address] [place], [place], [place] [address] Phone: [phone]; Fax: [phone]

## 2020-06-22 ENCOUNTER — Encounter: Payer: Self-pay | Admitting: Family Medicine

## 2020-06-22 ENCOUNTER — Ambulatory Visit: Payer: Medicaid Other | Admitting: Family Medicine

## 2020-06-22 ENCOUNTER — Encounter: Payer: Self-pay | Admitting: *Deleted

## 2020-06-22 ENCOUNTER — Other Ambulatory Visit: Payer: Self-pay | Admitting: *Deleted

## 2020-06-22 VITALS — BP 122/78 | HR 72 | Ht 62.0 in | Wt 186.9 lb

## 2020-06-22 DIAGNOSIS — I5032 Chronic diastolic (congestive) heart failure: Secondary | ICD-10-CM | POA: Diagnosis not present

## 2020-06-22 DIAGNOSIS — I2510 Atherosclerotic heart disease of native coronary artery without angina pectoris: Secondary | ICD-10-CM

## 2020-06-22 DIAGNOSIS — R0609 Other forms of dyspnea: Secondary | ICD-10-CM

## 2020-06-22 DIAGNOSIS — E782 Mixed hyperlipidemia: Secondary | ICD-10-CM | POA: Diagnosis not present

## 2020-06-22 DIAGNOSIS — I10 Essential (primary) hypertension: Secondary | ICD-10-CM

## 2020-06-22 DIAGNOSIS — R079 Chest pain, unspecified: Secondary | ICD-10-CM

## 2020-06-22 DIAGNOSIS — R0600 Dyspnea, unspecified: Secondary | ICD-10-CM | POA: Diagnosis not present

## 2020-06-22 MED ORDER — NITROGLYCERIN 0.4 MG SL SUBL: 0.4000 mg | SUBLINGUAL_TABLET | SUBLINGUAL | 3 refills | Status: DC | PRN

## 2020-06-22 MED ORDER — ISOSORBIDE MONONITRATE ER 30 MG PO TB24
30.0000 mg | ORAL_TABLET | Freq: Every day | ORAL | 3 refills | Status: DC
Start: 2020-06-22 — End: 2021-06-27

## 2020-06-22 MED ORDER — FUROSEMIDE 20 MG PO TABS: 20.0000 mg | ORAL_TABLET | Freq: Every day | ORAL | 3 refills | Status: DC

## 2020-06-22 NOTE — Patient Instructions (Addendum)
Medication Instructions:   Your physician has recommended you make the following change in your medication:   Start imdur 30 mg by mouth daily  Continue other medications the same  Labwork:  NONE  Testing/Procedures:  Your physician has requested that you have an echocardiogram. Echocardiography is a painless test that uses sound waves to create images of your heart. It provides your doctor with information about the size and shape of your heart and how well your heart's chambers and valves are working. This procedure takes approximately one hour. There are no restrictions for this procedure. Your physician has requested that you have a lexiscan myoview. For further information please visit [email or web address] Please follow instruction sheet, as given.  Follow-Up:  Your physician recommends that you schedule a follow-up appointment in: 4 weeks.  Any Other Special Instructions Will Be Listed Below (If Applicable).  If you need a refill on your cardiac medications before your next appointment, please call your pharmacy.

## 2020-06-29 ENCOUNTER — Telehealth: Payer: Self-pay | Admitting: Family Medicine

## 2020-06-29 NOTE — Telephone Encounter (Signed)
Called patient to let her know of [employer]. Patient verbalized understanding.

## 2020-06-29 NOTE — Telephone Encounter (Signed)
Patient called stating that every time she takes this medication isosorbide mononitrate (IMDUR) 30 MG 24 hr tablet [[id number]]  She gets a migraine and she can't get rid of it, she has it all day long.    Please call [phone]

## 2020-06-29 NOTE — Telephone Encounter (Signed)
Please advise the patient she may stop taking the long-acting nitrate if it is giving her significant headaches.  Unfortunately we do not treat migraines in the cardiology department so she may have to explore that with her PCP or neurologist.  Thank you

## 2020-06-29 NOTE — Telephone Encounter (Signed)
Called patient back about her message. Patient stated every day she has a migraine after she takes Imdur 30 mg. Patient stated her headache is usually on the left side and is throbbing.  Informed patient that one of the main side effects of Imdur is headaches. Patient was wondering if there was something else she could take or is there something she could take for the migraine. Will forward to Rennis Harding NP for advisement.

## 2020-06-30 ENCOUNTER — Encounter (HOSPITAL_COMMUNITY): Payer: Medicaid Other

## 2020-06-30 ENCOUNTER — Ambulatory Visit (HOSPITAL_COMMUNITY): Payer: Medicaid Other | Attending: Family Medicine

## 2020-06-30 ENCOUNTER — Ambulatory Visit (HOSPITAL_COMMUNITY): Payer: Medicaid Other

## 2020-07-16 ENCOUNTER — Other Ambulatory Visit: Payer: Self-pay

## 2020-07-16 ENCOUNTER — Ambulatory Visit (HOSPITAL_COMMUNITY)
Admission: RE | Admit: 2020-07-16 | Discharge: 2020-07-16 | Disposition: A | Discharge status: Home / Self Care | Payer: Medicaid Other | Source: Ambulatory Visit | Attending: Family Medicine | Admitting: Family Medicine

## 2020-07-16 DIAGNOSIS — I361 Nonrheumatic tricuspid (valve) insufficiency: Secondary | ICD-10-CM

## 2020-07-16 DIAGNOSIS — I340 Nonrheumatic mitral (valve) insufficiency: Secondary | ICD-10-CM | POA: Diagnosis not present

## 2020-07-16 DIAGNOSIS — R0600 Dyspnea, unspecified: Secondary | ICD-10-CM | POA: Insufficient documentation

## 2020-07-16 DIAGNOSIS — R0609 Other forms of dyspnea: Secondary | ICD-10-CM

## 2020-07-16 LAB — ECHOCARDIOGRAM COMPLETE
Area-P 1/2: 3.91 cm2
MV M vel: 4.87 m/s
MV Peak grad: 94.9 mmHg
S' Lateral: 2.55 cm

## 2020-07-16 NOTE — Progress Notes (Signed)
*  PRELIMINARY RESULTS* Echocardiogram 2D Echocardiogram has been performed.  Stacey Drain 07/16/2020, 3:35 PM

## 2020-07-19 NOTE — Progress Notes (Deleted)
Cardiology Office Note  Date: 07/19/2020   ID: Kristina Ramirez, DOB 02-Oct-1966, MRN [id number]  PCP:  Junie Spencer, FNP  Cardiologist:  No primary care provider on file. Electrophysiologist:  None   Chief Complaint: CABG  History of Present Illness: Kristina Ramirez is a 54 y.o. female with a history of CABG X 3 2016 (LIMA to the LAD and D1 and SVG to the RCA). HTN, HLD, Chronic HFpEF.   Last encounter with Dr Purvis Sheffield via telemedicine 01/16/2020. She had one episode of CP in the interval since previous visit and took 1 SL NTG. No changes in baseline exertional dyspnea. Some mild ankle swelling on occasion. Had some right sided chest wall tenderness in the right axillary area. Hx of panic attacks. Short term memory loss since CABG. She was to continue ASA 81 mg po daily, Toprol XL and atorvastatin. BP was normal. No changes to therapy. To continue Lasix for diastolic HF. Continue atorvastatin. Lipid panel was ordered.    She presents today with multiple complaints.  Complains of chest pain/chest tightness when walking short distances.  Complains of increased dyspnea on exertion and stating she cannot catch her breath when walking short distances.  States she has had "mini heart attacks" before.  She stated her blood pressure had been up but is normal today.  She denies any CVA/TIA like symptoms, PND or orthopnea,.  States she does have periodic palpitations which are short-lived and not associated with dizziness or lightheadedness.  No orthostatic symptoms per her statement.  No claudication-like symptoms, DVT or lower extremity edema.  Recently saw her PCP and lipid levels were significantly high.  Apparently at some point she had stopped her atorvastatin.  Atorvastatin 80 mg was restarted by PCP on 04/26/2020  Past Medical History:  Diagnosis Date  . CAD (coronary artery disease)    status post three-vessel CABG with LIMA to the LAD and D1 and SVG to the RCA.  There was residual nonobstructive  disease in the left circumflex  . Chronic diastolic heart failure (HCC)   . Hypertension   . Mixed dyslipidemia     Past Surgical History:  Procedure Laterality Date  . CARDIAC CATHETERIZATION  01/21/2015  . CORONARY ARTERY BYPASS GRAFT    . TUBAL LIGATION      Current Outpatient Medications  Medication Sig Dispense Refill  . aspirin EC 81 MG tablet Take 1 tablet (81 mg total) by mouth daily. 90 tablet 3  . atorvastatin (LIPITOR) 80 MG tablet Take 1 tablet (80 mg total) by mouth at bedtime. 30 tablet 6  . buPROPion (WELLBUTRIN XL) 300 MG 24 hr tablet Take 1 tablet (300 mg total) by mouth daily. 30 tablet 2  . cetirizine (ZYRTEC) 10 MG tablet TAKE 1 TABLET BY MOUTH EVERY DAY 90 tablet 1  . furosemide (LASIX) 20 MG tablet Take 1 tablet (20 mg total) by mouth daily. (Needs to be seen before next refill) 90 tablet 3  . gabapentin (NEURONTIN) 800 MG tablet Take 1 tablet (800 mg total) by mouth 3 (three) times daily. (Needs to be seen before next refill) 90 tablet 3  . isosorbide mononitrate (IMDUR) 30 MG 24 hr tablet Take 1 tablet (30 mg total) by mouth daily. 90 tablet 3  . metoprolol tartrate (LOPRESSOR) 25 MG tablet Take 1 tablet (25 mg total) by mouth 2 (two) times daily. 180 tablet 1  . nitroGLYCERIN (NITROSTAT) 0.4 MG SL tablet Place 1 tablet (0.4 mg total) under the tongue every 5 (  five) minutes x 3 doses as needed for chest pain (if no relief after 3rd dose, proceed to the ED for an evaluation or call 911). 25 tablet 3  . Olopatadine HCl 0.2 % SOLN Apply 1 drop to eye every morning.    . RESTASIS 0.05 % ophthalmic emulsion Place 1 drop into both eyes 2 (two) times daily.    . sertraline (ZOLOFT) 100 MG tablet Take 1.5 tablets (150 mg total) by mouth daily. (Needs to be seen before next refill) 45 tablet 5  . topiramate (TOPAMAX) 100 MG tablet Take 1 tablet (100 mg total) by mouth 2 (two) times daily. 180 tablet 1  . traZODone (DESYREL) 50 MG tablet Take 1-2 tablets (50-100 mg total) by  mouth at bedtime as needed for sleep. 60 tablet 3   No current facility-administered medications for this visit.   Allergies:  Patient has no known allergies.   Social History: The patient  reports that she quit smoking about 6 years ago. She has never used smokeless tobacco. She reports that she does not drink alcohol and does not use drugs.   Family History: The patient's family history includes CAD in her father.   ROS:  Please see the history of present illness. Otherwise, complete review of systems is positive for none.  All other systems are reviewed and negative.   Physical Exam: VS:  There were no vitals taken for this visit., BMI There is no height or weight on file to calculate BMI.  Wt Readings from Last 3 Encounters:  06/22/20 186 lb 14.4 oz (84.8 kg)  04/26/20 189 lb 9.6 oz (86 kg)  01/16/20 182 lb (82.6 kg)    General: Patient appears comfortable at rest. Neck: Supple, no elevated JVP or carotid bruits, no thyromegaly. Lungs: Clear to auscultation, nonlabored breathing at rest. Cardiac: Regular rate and rhythm, no S3 or significant systolic murmur, no pericardial rub. Extremities: No pitting edema, distal pulses 2+. Skin: Warm and dry. Musculoskeletal: No kyphosis. Neuropsychiatric: Alert and oriented x3, affect grossly appropriate.  ECG:  An ECG dated 06/22/2020 was personally reviewed today and demonstrated:  Sinus rhythm rate of 74, nonspecific T wave abnormality.  Recent Labwork: 04/26/2020: ALT 25; AST 21; BUN 11; Creatinine, Ser 0.99; Hemoglobin 13.4; Platelets 326; Potassium 3.9; Sodium 140     Component Value Date/Time   CHOL 343 (H) 04/26/2020 1235   TRIG 556 (HH) 04/26/2020 1235   HDL 45 04/26/2020 1235   CHOLHDL 7.6 (H) 04/26/2020 1235   LDLCALC 183 (H) 04/26/2020 1235    Other Studies Reviewed Today:  Echocardiogram 07/16/2020 1. Left ventricular ejection fraction, by estimation, is 60 to 65%. The left ventricle has normal function. The left  ventricle has no regional wall motion abnormalities. Left ventricular diastolic parameters were normal. 2. Right ventricular systolic function is low normal. The right ventricular size is normal. There is normal pulmonary artery systolic pressure. The estimated right ventricular systolic pressure is 16.8 mmHg. 3. The mitral valve is grossly normal. Mild mitral valve regurgitation. 4. The aortic valve is tricuspid. Aortic valve regurgitation is not visualized. 5. The inferior vena cava is normal in size with greater than 50% respiratory variability, suggesting right atrial pressure of 3 mmHg.  Assessment and Plan:  No diagnosis found. 1. CAD in native artery History of three-vessel bypass in 2016(LIMA to the LAD and D1 and SVG to the RCA).  Continue aspirin 81 mg daily, nitroglycerin 0.4 sublingual as needed.  Patient states she has had some recent  increased chest pain with exertion.  States she had some radiation to her right neck.  Denies any nausea, vomiting, or diaphoresis associated.  Continue aspirin 81 mg, sublingual nitroglycerin as needed for chest pain.  We are adding Imdur 30 mg daily for chest pain.  Lexiscan Myoview ordered below.  2. Essential (primary) hypertension Blood pressure is well controlled today with a blood pressure of 122/78.  Continue Lopressor 25 mg p.o. twice daily.  3. Chronic diastolic heart failure (HCC) Has a history of chronic diastolic heart failure.  She has not had a recent recorded echocardiogram with our system.  States she had echo at [facility] in the past.  States she ran out of her Lasix.  Please refill Lasix 20 mg daily.  4. Mixed hyperlipidemia Recent elevated lipids at PCP office.  Lipid panel on 04/26/2020 showed total cholesterol 343, triglycerides 556, HDL 45, VLDL 115, LDL 183, cholesterol/HDL ratio 7.6.  It appears she had stopped taking her atorvastatin.  Her PCP restarted the atorvastatin 80 mg on 04/26/2020.  5.  Exertional chest pain Recently complaining of exertional chest pain radiating to right neck.  Denies any associated nausea, vomiting or diaphoresis.  History of three-vessel bypass in 2016.  Please get a Lexiscan Myoview stress test.  Start Imdur 30 mg daily.  6. DOE (dyspnea on exertion) Complaining of increased dyspnea on exertion.  She states when she has to walk from her apartment to the grocery store or any other exertional activity she has increasing dyspnea on exertion.  Please get an echocardiogram to assess LV function, diastolic function, valvular function.   Medication Adjustments/Labs and Tests Ordered: Current medicines are reviewed at length with the patient today.  Concerns regarding medicines are outlined above.   Disposition: Follow-up with Dr. Diona Browner or APP and 6 to 8 weeks after echo and stress  Signed, Rennis Harding, NP 07/19/2020 10:18 PM    [facility] at [facility] [address] [place], [place], [place] [address] Phone: [phone]; Fax: [phone]

## 2020-07-20 ENCOUNTER — Ambulatory Visit: Payer: Medicaid Other | Admitting: Family Medicine

## 2020-07-26 ENCOUNTER — Other Ambulatory Visit: Payer: Self-pay

## 2020-07-26 ENCOUNTER — Ambulatory Visit: Payer: Medicaid Other | Admitting: Nurse Practitioner

## 2020-07-26 ENCOUNTER — Encounter: Payer: Self-pay | Admitting: Nurse Practitioner

## 2020-07-26 ENCOUNTER — Telehealth: Payer: Self-pay | Admitting: *Deleted

## 2020-07-26 ENCOUNTER — Other Ambulatory Visit (HOSPITAL_COMMUNITY)
Admission: RE | Admit: 2020-07-26 | Discharge: 2020-07-26 | Disposition: A | Discharge status: Home / Self Care | Payer: Medicaid Other | Source: Ambulatory Visit | Attending: Nurse Practitioner | Admitting: Nurse Practitioner

## 2020-07-26 VITALS — BP 135/74 | HR 74 | Temp 97.2°F | Resp 20 | Ht 62.0 in | Wt 191.0 lb

## 2020-07-26 DIAGNOSIS — Z202 Contact with and (suspected) exposure to infections with a predominantly sexual mode of transmission: Secondary | ICD-10-CM | POA: Insufficient documentation

## 2020-07-26 NOTE — Progress Notes (Signed)
   Subjective:    Patient ID: Kristina Ramirez, female    DOB: [birth year], 54 y.o.   MRN: [id number]   Chief Complaint: STD check   HPI Patient states that her partner has been permiscous.she wants to be checked for all STD today. She denies any vaginal discharge , foul smell or itching.  Review of Systems  Constitutional: Negative for diaphoresis.  Eyes: Negative for pain.  Respiratory: Negative for shortness of breath.   Cardiovascular: Negative for chest pain, palpitations and leg swelling.  Gastrointestinal: Negative for abdominal pain.  Endocrine: Negative for polydipsia.  Skin: Negative for rash.  Neurological: Negative for dizziness, weakness and headaches.  Hematological: Does not bruise/bleed easily.  All other systems reviewed and are negative.      Objective:   Physical Exam Vitals and nursing note reviewed.  Constitutional:      Appearance: Normal appearance.  Cardiovascular:     Rate and Rhythm: Normal rate and regular rhythm.     Heart sounds: Normal heart sounds.  Pulmonary:     Effort: Pulmonary effort is normal.     Breath sounds: Normal breath sounds.  Skin:    General: Skin is warm.  Neurological:     General: No focal deficit present.     Mental Status: She is alert.  Psychiatric:        Mood and Affect: Mood normal.    BP 135/74   Pulse 74   Temp (!) 97.2 F (36.2 C) (Temporal)   Resp 20   Ht 5\' 2"  (1.575 m)   Wt 191 lb (86.6 kg)   SpO2 98%   BMI 34.93 kg/m         Assessment & Plan:  [name] in today with chief complaint of STD check   1. Possible exposure to STD Safe sex encouraged - Chlamydia/Gonococcus/Trichomonas, NAA - STD Screen (8) - HIV antibody (with reflex)    The above assessment and management plan was discussed with the patient. The patient verbalized understanding of and has agreed to the management plan. Patient is aware to call the clinic if symptoms persist or worsen. Patient is aware when to return to the  clinic for a follow-up visit. Patient educated on when it is appropriate to go to the emergency department.   [name], FNP

## 2020-07-26 NOTE — Addendum Note (Signed)
Addended by: Cleda Daub on: 07/26/2020 04:34 PM   Modules accepted: Orders

## 2020-07-26 NOTE — Patient Instructions (Signed)

## 2020-07-26 NOTE — Telephone Encounter (Signed)
Patient informed. Copy sent to PCP °

## 2020-07-26 NOTE — Telephone Encounter (Signed)
-----   Message from Netta Neat., NP sent at 07/18/2020  8:33 PM EDT ----- Please call the patient and let her know the echocardiogram shows good pumping function and a very mildly leaking mitral valve. Nothing on the echo that would explain her shortness of breath. Thanks

## 2020-07-27 LAB — STD SCREEN (8)
HIV Screen 4th Generation wRfx: NONREACTIVE
HSV 1 Glycoprotein G Ab, IgG: 13.7 index — ABNORMAL HIGH (ref 0.00–0.90)
HSV 2 IgG, Type Spec: <0.91 index (ref 0.00–0.90)
Hep A IgM: NEGATIVE
Hep B C IgM: NEGATIVE
Hep C Virus Ab: <0.1 s/co ratio (ref 0.0–0.9)
Hepatitis B Surface Ag: NEGATIVE
RPR Ser Ql: NONREACTIVE

## 2020-07-30 LAB — URINE CYTOLOGY ANCILLARY ONLY
Candida Urine: NEGATIVE
Chlamydia: NEGATIVE
Comment: NEGATIVE
Comment: NEGATIVE
Comment: NORMAL
Neisseria Gonorrhea: NEGATIVE
Trichomonas: NEGATIVE

## 2020-08-04 ENCOUNTER — Telehealth: Payer: Self-pay | Admitting: Family

## 2020-08-04 NOTE — Telephone Encounter (Signed)
Pt aware of results and voiced understanding. 

## 2020-09-30 ENCOUNTER — Other Ambulatory Visit: Payer: Self-pay | Admitting: Family

## 2020-09-30 DIAGNOSIS — G43109 Migraine with aura, not intractable, without status migrainosus: Secondary | ICD-10-CM

## 2020-09-30 DIAGNOSIS — F411 Generalized anxiety disorder: Secondary | ICD-10-CM

## 2020-09-30 DIAGNOSIS — F321 Major depressive disorder, single episode, moderate: Secondary | ICD-10-CM

## 2020-12-04 ENCOUNTER — Other Ambulatory Visit: Payer: Self-pay | Admitting: Family

## 2020-12-04 DIAGNOSIS — F321 Major depressive disorder, single episode, moderate: Secondary | ICD-10-CM

## 2020-12-04 DIAGNOSIS — F411 Generalized anxiety disorder: Secondary | ICD-10-CM

## 2021-01-20 ENCOUNTER — Other Ambulatory Visit: Payer: Self-pay | Admitting: Family

## 2021-01-20 DIAGNOSIS — F321 Major depressive disorder, single episode, moderate: Secondary | ICD-10-CM

## 2021-01-20 DIAGNOSIS — F411 Generalized anxiety disorder: Secondary | ICD-10-CM

## 2021-01-20 NOTE — Telephone Encounter (Signed)
[place]. NTBS 30 days given 12/06/20

## 2021-01-27 ENCOUNTER — Ambulatory Visit (INDEPENDENT_AMBULATORY_CARE_PROVIDER_SITE_OTHER): Payer: Medicaid Other | Admitting: Family

## 2021-01-27 ENCOUNTER — Encounter: Payer: Self-pay | Admitting: Family

## 2021-01-27 DIAGNOSIS — F411 Generalized anxiety disorder: Secondary | ICD-10-CM | POA: Diagnosis not present

## 2021-01-27 DIAGNOSIS — I10 Essential (primary) hypertension: Secondary | ICD-10-CM

## 2021-01-27 DIAGNOSIS — I2510 Atherosclerotic heart disease of native coronary artery without angina pectoris: Secondary | ICD-10-CM | POA: Diagnosis not present

## 2021-01-27 DIAGNOSIS — I252 Old myocardial infarction: Secondary | ICD-10-CM

## 2021-01-27 DIAGNOSIS — R197 Diarrhea, unspecified: Secondary | ICD-10-CM

## 2021-01-27 DIAGNOSIS — I5032 Chronic diastolic (congestive) heart failure: Secondary | ICD-10-CM

## 2021-01-27 DIAGNOSIS — G43109 Migraine with aura, not intractable, without status migrainosus: Secondary | ICD-10-CM

## 2021-01-27 DIAGNOSIS — Z1211 Encounter for screening for malignant neoplasm of colon: Secondary | ICD-10-CM | POA: Diagnosis not present

## 2021-01-27 DIAGNOSIS — E785 Hyperlipidemia, unspecified: Secondary | ICD-10-CM

## 2021-01-27 DIAGNOSIS — E669 Obesity, unspecified: Secondary | ICD-10-CM | POA: Diagnosis not present

## 2021-01-27 DIAGNOSIS — F321 Major depressive disorder, single episode, moderate: Secondary | ICD-10-CM | POA: Diagnosis not present

## 2021-01-27 MED ORDER — BUSPIRONE HCL 5 MG PO TABS: 5.0000 mg | ORAL_TABLET | Freq: Three times a day (TID) | ORAL | 1 refills | Status: DC | PRN

## 2021-01-27 MED ORDER — PROBIOTIC 1-250 BILLION-MG PO CAPS: 1.0000 | ORAL_CAPSULE | Freq: Every day | ORAL | 1 refills | Status: DC

## 2021-01-27 NOTE — Addendum Note (Signed)
Addended by: Jannifer Rodney A on: 01/27/2021 09:25 AM   Modules accepted: Level of Service

## 2021-01-27 NOTE — Progress Notes (Signed)
Virtual Visit via telephone Note Due to COVID-19 pandemic this visit was conducted virtually. This visit type was conducted due to national recommendations for restrictions regarding the COVID-19 Pandemic (e.g. social distancing, sheltering in place) in an effort to limit this patient's exposure and mitigate transmission in our community. All issues noted in this document were discussed and addressed.  A physical exam was not performed with this format.  I connected with Kristina Ramirez on 01/27/21 at 8:48 AM  by telephone and verified that I am speaking with the correct person using two identifiers. Kristina Ramirez is currently located at home and no one is currently with her during visit. The provider, Evelina Dun, FNP is located in their office at time of visit.  I discussed the limitations, risks, security and privacy concerns of performing an evaluation and management service by telephone and the availability of in person appointments. I also discussed with the patient that there may be a patient responsible charge related to this service. The patient expressed understanding and agreed to proceed.   History and Present Illness:  Pt presents to the office today for chronic follow up.  She has hx of NSTEMI and CHF and followed by Cardiologists.  Hypertension This is a chronic problem. The current episode started more than 1 year ago. The problem has been resolved since onset. The problem is controlled. Associated symptoms include anxiety. Pertinent negatives include no malaise/fatigue, peripheral edema or shortness of breath (If walking up  a hill). Risk factors for coronary artery disease include dyslipidemia, obesity and sedentary lifestyle. The current treatment provides moderate improvement. Hypertensive end-organ damage includes heart failure.  Migraine  This is a chronic problem. The current episode started more than 1 year ago. The problem occurs intermittently (every other day). Associated  symptoms include nausea, phonophobia and photophobia. Her past medical history is significant for hypertension.  Congestive Heart Failure Presents for follow-up visit. Pertinent negatives include no edema or shortness of breath (If walking up  a hill). The symptoms have been stable.  Hyperlipidemia This is a chronic problem. The current episode started more than 1 year ago. The problem is controlled. Recent lipid tests were reviewed and are normal. Pertinent negatives include no shortness of breath (If walking up  a hill). Current antihyperlipidemic treatment includes statins. The current treatment provides moderate improvement of lipids. Risk factors for coronary artery disease include dyslipidemia, hypertension and a sedentary lifestyle.  Depression        This is a chronic problem.  The current episode started more than 1 year ago.   The onset quality is gradual.   The problem occurs intermittently.  Associated symptoms include irritable, restlessness, decreased interest and sad.  Associated symptoms include no helplessness and no hopelessness.  Past treatments include SSRIs - Selective serotonin reuptake inhibitors.  Compliance with treatment is good.  Past medical history includes anxiety.   Anxiety Presents for follow-up visit. Symptoms include depressed mood, excessive worry, irritability, nausea and restlessness. Patient reports no shortness of breath (If walking up  a hill).    Diarrhea  This is a new problem. The current episode started 1 to 4 weeks ago. The problem occurs less than 2 times per day. The problem has been waxing and waning. The stool consistency is described as watery. Treatments tried: probiotic       Review of Systems  Constitutional: Positive for irritability. Negative for malaise/fatigue.  Eyes: Positive for photophobia.  Respiratory: Negative for shortness of breath (If walking up  a hill).   Gastrointestinal: Positive for diarrhea and nausea.   Psychiatric/Behavioral: Positive for depression.  All other systems reviewed and are negative.    Observations/Objective: No SOB or distress noted  Assessment and Plan: 1. Atherosclerosis of native coronary artery without angina pectoris, unspecified whether native or transplanted heart - CMP14+EGFR; Future - CBC with Differential/Platelet; Future  2. Essential (primary) hypertension - CMP14+EGFR; Future - CBC with Differential/Platelet; Future  3. Migraine with aura and without status migrainosus, not intractable - CMP14+EGFR; Future - CBC with Differential/Platelet; Future  4. Chronic diastolic CHF (congestive heart failure) (HCC) - CMP14+EGFR; Future - CBC with Differential/Platelet; Future  5. Generalized anxiety disorder - busPIRone (BUSPAR) 5 MG tablet; Take 1 tablet (5 mg total) by mouth 3 (three) times daily as needed.  Dispense: 90 tablet; Refill: 1 - CMP14+EGFR; Future - CBC with Differential/Platelet; Future  6. Hyperlipidemia, unspecified hyperlipidemia type - CMP14+EGFR; Future - CBC with Differential/Platelet; Future - Lipid panel; Future  7. Obesity (BMI 30-39.9) - CMP14+EGFR; Future - CBC with Differential/Platelet; Future  8. Depression, major, single episode, moderate (HCC) - busPIRone (BUSPAR) 5 MG tablet; Take 1 tablet (5 mg total) by mouth 3 (three) times daily as needed.  Dispense: 90 tablet; Refill: 1 - CMP14+EGFR; Future - CBC with Differential/Platelet; Future  9. History of non-ST elevation myocardial infarction (NSTEMI) - CMP14+EGFR; Future - CBC with Differential/Platelet; Future  10. Colon cancer screening - Ambulatory referral to Gastroenterology - CMP14+EGFR; Future - CBC with Differential/Platelet; Future  11. Diarrhea, unspecified type BRAT diet Force fluids - Bacillus Coagulans-Inulin (PROBIOTIC) 1-250 BILLION-MG CAPS; Take 1 capsule by mouth daily.  Dispense: 90 capsule; Refill: 1  Will add Buspar 5 mg TID prn  Stress  management  RTO in 6 weeks   I discussed the assessment and treatment plan with the patient. The patient was provided an opportunity to ask questions and all were answered. The patient agreed with the plan and demonstrated an understanding of the instructions.   The patient was advised to call back or seek an in-person evaluation if the symptoms worsen or if the condition fails to improve as anticipated.  The above assessment and management plan was discussed with the patient. The patient verbalized understanding of and has agreed to the management plan. Patient is aware to call the clinic if symptoms persist or worsen. Patient is aware when to return to the clinic for a follow-up visit. Patient educated on when it is appropriate to go to the emergency department.   Time call ended:  9:09 AM  I provided 21 minutes of non-face-to-face time during this encounter.    Evelina Dun, FNP

## 2021-01-28 ENCOUNTER — Ambulatory Visit: Payer: Medicaid Other | Admitting: Family

## 2021-02-01 ENCOUNTER — Encounter: Payer: Self-pay | Admitting: Internal Medicine

## 2021-02-06 ENCOUNTER — Other Ambulatory Visit: Payer: Self-pay | Admitting: Family

## 2021-02-06 DIAGNOSIS — F321 Major depressive disorder, single episode, moderate: Secondary | ICD-10-CM

## 2021-02-06 DIAGNOSIS — G43109 Migraine with aura, not intractable, without status migrainosus: Secondary | ICD-10-CM

## 2021-02-06 DIAGNOSIS — I10 Essential (primary) hypertension: Secondary | ICD-10-CM

## 2021-02-06 DIAGNOSIS — F411 Generalized anxiety disorder: Secondary | ICD-10-CM

## 2021-03-01 ENCOUNTER — Other Ambulatory Visit: Payer: Self-pay | Admitting: Family

## 2021-03-01 DIAGNOSIS — F411 Generalized anxiety disorder: Secondary | ICD-10-CM

## 2021-03-01 DIAGNOSIS — F321 Major depressive disorder, single episode, moderate: Secondary | ICD-10-CM

## 2021-03-07 ENCOUNTER — Encounter: Payer: Self-pay | Admitting: Family Medicine

## 2021-03-08 ENCOUNTER — Other Ambulatory Visit: Payer: Self-pay | Admitting: Family

## 2021-03-08 DIAGNOSIS — Z1231 Encounter for screening mammogram for malignant neoplasm of breast: Secondary | ICD-10-CM

## 2021-03-09 ENCOUNTER — Encounter: Payer: Self-pay | Admitting: Family Medicine

## 2021-03-10 ENCOUNTER — Encounter: Payer: Self-pay | Admitting: Internal Medicine

## 2021-03-10 ENCOUNTER — Ambulatory Visit: Payer: Medicaid Other | Admitting: Family

## 2021-03-10 ENCOUNTER — Ambulatory Visit: Payer: Medicaid Other | Admitting: Nurse Practitioner

## 2021-03-10 ENCOUNTER — Encounter: Payer: Self-pay | Admitting: Family

## 2021-03-10 NOTE — Progress Notes (Deleted)
Primary Care Physician:  Junie Spencer, FNP Primary Gastroenterologist:  Dr. Jena Gauss  No chief complaint on file.   HPI:   Kristina Ramirez is a 55 y.o. female who presents on referral from primary care to schedule colonoscopy.  Nurse/phone triage was deferred office visit due to medications likely necessitating augmented sedation as well as complaints of diarrhea.  No history of colonoscopy found in our system.  Today she states she doing okay overall.  Past Medical History:  Diagnosis Date  . CAD (coronary artery disease)    status post three-vessel CABG with LIMA to the LAD and D1 and SVG to the RCA.  There was residual nonobstructive disease in the left circumflex  . Chronic diastolic heart failure (HCC)   . Hypertension   . Mixed dyslipidemia     Past Surgical History:  Procedure Laterality Date  . CARDIAC CATHETERIZATION  01/21/2015  . CORONARY ARTERY BYPASS GRAFT    . TUBAL LIGATION      Current Outpatient Medications  Medication Sig Dispense Refill  . aspirin EC 81 MG tablet Take 1 tablet (81 mg total) by mouth daily. (Patient not taking: Reported on 01/27/2021) 90 tablet 3  . atorvastatin (LIPITOR) 80 MG tablet Take 1 tablet (80 mg total) by mouth at bedtime. 30 tablet 6  . Bacillus Coagulans-Inulin (PROBIOTIC) 1-250 BILLION-MG CAPS Take 1 capsule by mouth daily. 90 capsule 1  . buPROPion (WELLBUTRIN XL) 300 MG 24 hr tablet TAKE 1 TABLET BY MOUTH EVERY DAY 90 tablet 0  . busPIRone (BUSPAR) 5 MG tablet TAKE 1 TABLET BY MOUTH 3 TIMES DAILY AS NEEDED. 270 tablet 0  . cetirizine (ZYRTEC) 10 MG tablet TAKE 1 TABLET BY MOUTH EVERY DAY 90 tablet 1  . furosemide (LASIX) 20 MG tablet Take 1 tablet (20 mg total) by mouth daily. (Needs to be seen before next refill) 90 tablet 3  . gabapentin (NEURONTIN) 800 MG tablet Take 1 tablet (800 mg total) by mouth 3 (three) times daily. 90 tablet 2  . isosorbide mononitrate (IMDUR) 30 MG 24 hr tablet Take 1 tablet (30 mg total) by mouth  daily. 90 tablet 3  . metoprolol tartrate (LOPRESSOR) 25 MG tablet TAKE 1 TABLET BY MOUTH TWICE A DAY 180 tablet 0  . nitroGLYCERIN (NITROSTAT) 0.4 MG SL tablet Place 1 tablet (0.4 mg total) under the tongue every 5 (five) minutes x 3 doses as needed for chest pain (if no relief after 3rd dose, proceed to the ED for an evaluation or call 911). 25 tablet 3  . Olopatadine HCl 0.2 % SOLN Apply 1 drop to eye every morning.    . RESTASIS 0.05 % ophthalmic emulsion Place 1 drop into both eyes 2 (two) times daily.    . sertraline (ZOLOFT) 100 MG tablet TAKE 1.5 TABLETS (150MG  TOTAL) BY MOUTH DAILY 135 tablet 0  . topiramate (TOPAMAX) 100 MG tablet TAKE 1 TABLET BY MOUTH TWICE A DAY 60 tablet 2  . traZODone (DESYREL) 50 MG tablet TAKE 1-2 TABLETS BY MOUTH AT BEDTIME AS NEEDED FOR SLEEP. 180 tablet 0   No current facility-administered medications for this visit.    Allergies as of 03/10/2021  . (No Known Allergies)    Family History  Problem Relation Age of Onset  . CAD Father     Social History   Socioeconomic History  . Marital status: Single    Spouse name: Not on file  . Number of children: Not on file  . Years of  education: Not on file  . Highest education level: Not on file  Occupational History  . Not on file  Tobacco Use  . Smoking status: Former Smoker    Quit date: 12/05/2013    Years since quitting: 7.2  . Smokeless tobacco: Never Used  Vaping Use  . Vaping Use: Never used  Substance and Sexual Activity  . Alcohol use: Never  . Drug use: Never  . Sexual activity: Yes  Other Topics Concern  . Not on file  Social History Narrative  . Not on file   Social Determinants of Health   Financial Resource Strain: Not on file  Food Insecurity: Not on file  Transportation Needs: Not on file  Physical Activity: Not on file  Stress: Not on file  Social Connections: Not on file  Intimate Partner Violence: Not on file    Subjective:*** Review of Systems  Constitutional:  Negative for chills, fever, malaise/fatigue and weight loss.  HENT: Negative for congestion and sore throat.   Respiratory: Negative for cough and shortness of breath.   Cardiovascular: Negative for chest pain and palpitations.  Gastrointestinal: Negative for abdominal pain, blood in stool, diarrhea, melena, nausea and vomiting.  Musculoskeletal: Negative for joint pain and myalgias.  Skin: Negative for rash.  Neurological: Negative for dizziness and weakness.  Endo/Heme/Allergies: Does not bruise/bleed easily.  Psychiatric/Behavioral: Negative for depression. The patient is not nervous/anxious.   All other systems reviewed and are negative.      Objective: There were no vitals taken for this visit. Physical Exam Vitals and nursing note reviewed.  Constitutional:      General: She is not in acute distress.    Appearance: Normal appearance. She is well-developed. She is not ill-appearing, toxic-appearing or diaphoretic.  HENT:     Head: Normocephalic and atraumatic.     Nose: No congestion or rhinorrhea.  Eyes:     General: No scleral icterus. Cardiovascular:     Rate and Rhythm: Normal rate and regular rhythm.     Heart sounds: Normal heart sounds.  Pulmonary:     Effort: Pulmonary effort is normal. No respiratory distress.     Breath sounds: Normal breath sounds.  Abdominal:     General: Bowel sounds are normal.     Palpations: Abdomen is soft. There is no hepatomegaly, splenomegaly or mass.     Tenderness: There is no abdominal tenderness. There is no guarding or rebound.     Hernia: No hernia is present.  Skin:    General: Skin is warm and dry.     Coloration: Skin is not jaundiced.     Findings: No rash.  Neurological:     General: No focal deficit present.     Mental Status: She is alert and oriented to person, place, and time.  Psychiatric:        Attention and Perception: Attention normal.        Mood and Affect: Mood normal.        Speech: Speech normal.         Behavior: Behavior normal.        Thought Content: Thought content normal.        Cognition and Memory: Cognition and memory normal.      Assessment:  ***   Plan: ***    Thank you for allowing [place] to participate in the care of Seymour Bars, DNP, AGNP-C Adult & Gerontological Nurse Practitioner [facility]   03/10/2021 7:43 AM  Disclaimer: This note was dictated with voice recognition software. Similar sounding words can inadvertently be transcribed and may not be corrected upon review.

## 2021-04-28 ENCOUNTER — Other Ambulatory Visit: Payer: Self-pay | Admitting: Family

## 2021-04-28 ENCOUNTER — Other Ambulatory Visit: Payer: Self-pay | Admitting: Family Medicine

## 2021-04-28 DIAGNOSIS — E785 Hyperlipidemia, unspecified: Secondary | ICD-10-CM

## 2021-04-28 DIAGNOSIS — G43109 Migraine with aura, not intractable, without status migrainosus: Secondary | ICD-10-CM

## 2021-04-28 DIAGNOSIS — I10 Essential (primary) hypertension: Secondary | ICD-10-CM

## 2021-04-28 DIAGNOSIS — F411 Generalized anxiety disorder: Secondary | ICD-10-CM

## 2021-04-28 DIAGNOSIS — F321 Major depressive disorder, single episode, moderate: Secondary | ICD-10-CM

## 2021-06-06 ENCOUNTER — Other Ambulatory Visit: Payer: Self-pay | Admitting: Family

## 2021-06-06 DIAGNOSIS — F411 Generalized anxiety disorder: Secondary | ICD-10-CM

## 2021-06-06 DIAGNOSIS — F321 Major depressive disorder, single episode, moderate: Secondary | ICD-10-CM

## 2021-06-26 ENCOUNTER — Other Ambulatory Visit: Payer: Self-pay | Admitting: Family

## 2021-06-26 ENCOUNTER — Other Ambulatory Visit: Payer: Self-pay | Admitting: Family Medicine

## 2021-06-26 DIAGNOSIS — I10 Essential (primary) hypertension: Secondary | ICD-10-CM

## 2021-06-26 DIAGNOSIS — G43109 Migraine with aura, not intractable, without status migrainosus: Secondary | ICD-10-CM

## 2021-07-01 ENCOUNTER — Other Ambulatory Visit: Payer: Self-pay | Admitting: Family Medicine

## 2021-07-04 ENCOUNTER — Other Ambulatory Visit: Payer: Self-pay | Admitting: *Deleted

## 2021-07-04 MED ORDER — NITROGLYCERIN 0.4 MG SL SUBL
0.4000 mg | SUBLINGUAL_TABLET | SUBLINGUAL | 0 refills | Status: DC | PRN
Start: 2021-07-04 — End: 2021-09-07

## 2021-07-06 ENCOUNTER — Other Ambulatory Visit: Payer: Self-pay | Admitting: *Deleted

## 2021-07-06 DIAGNOSIS — G43109 Migraine with aura, not intractable, without status migrainosus: Secondary | ICD-10-CM

## 2021-07-06 DIAGNOSIS — F411 Generalized anxiety disorder: Secondary | ICD-10-CM

## 2021-07-06 DIAGNOSIS — F321 Major depressive disorder, single episode, moderate: Secondary | ICD-10-CM

## 2021-07-06 MED ORDER — SERTRALINE HCL 100 MG PO TABS: 150.0000 mg | ORAL_TABLET | Freq: Every day | ORAL | 0 refills | Status: DC

## 2021-07-06 MED ORDER — TOPIRAMATE 100 MG PO TABS: 100.0000 mg | ORAL_TABLET | Freq: Two times a day (BID) | ORAL | 0 refills | Status: DC

## 2021-07-07 ENCOUNTER — Ambulatory Visit: Payer: Medicaid Other | Admitting: Family

## 2021-07-07 ENCOUNTER — Encounter: Payer: Self-pay | Admitting: Family

## 2021-07-07 DIAGNOSIS — I2510 Atherosclerotic heart disease of native coronary artery without angina pectoris: Secondary | ICD-10-CM

## 2021-07-07 DIAGNOSIS — I5032 Chronic diastolic (congestive) heart failure: Secondary | ICD-10-CM | POA: Diagnosis not present

## 2021-07-07 DIAGNOSIS — E785 Hyperlipidemia, unspecified: Secondary | ICD-10-CM

## 2021-07-07 DIAGNOSIS — G43109 Migraine with aura, not intractable, without status migrainosus: Secondary | ICD-10-CM

## 2021-07-07 DIAGNOSIS — E669 Obesity, unspecified: Secondary | ICD-10-CM

## 2021-07-07 DIAGNOSIS — I10 Essential (primary) hypertension: Secondary | ICD-10-CM

## 2021-07-07 DIAGNOSIS — F321 Major depressive disorder, single episode, moderate: Secondary | ICD-10-CM

## 2021-07-07 DIAGNOSIS — F411 Generalized anxiety disorder: Secondary | ICD-10-CM | POA: Diagnosis not present

## 2021-07-07 DIAGNOSIS — I252 Old myocardial infarction: Secondary | ICD-10-CM | POA: Diagnosis not present

## 2021-07-07 NOTE — Patient Instructions (Signed)
Health Maintenance, Female Adopting a healthy lifestyle and getting preventive care are important in promoting health and wellness. Ask your health care provider about: The right schedule for you to have regular tests and exams. Things you can do on your own to prevent diseases and keep yourself healthy. What should I know about diet, weight, and exercise? Eat a healthy diet  Eat a diet that includes plenty of vegetables, fruits, low-fat dairy products, and lean protein. Do not eat a lot of foods that are high in solid fats, added sugars, or sodium. Maintain a healthy weight Body mass index (BMI) is used to identify weight problems. It estimates body fat based on height and weight. Your health care provider can help determine your BMI and help you achieve or maintain a healthy weight. Get regular exercise Get regular exercise. This is one of the most important things you can do for your health. Most adults should: Exercise for at least 150 minutes each week. The exercise should increase your heart rate and make you sweat (moderate-intensity exercise). Do strengthening exercises at least twice a week. This is in addition to the moderate-intensity exercise. Spend less time sitting. Even light physical activity can be beneficial. Watch cholesterol and blood lipids Have your blood tested for lipids and cholesterol at 56 years of age, then have this test every 5 years. Have your cholesterol levels checked more often if: Your lipid or cholesterol levels are high. You are older than 56 years of age. You are at high risk for heart disease. What should I know about cancer screening? Depending on your health history and family history, you may need to have cancer screening at various ages. This may include screening for: Breast cancer. Cervical cancer. Colorectal cancer. Skin cancer. Lung cancer. What should I know about heart disease, diabetes, and high blood pressure? Blood pressure and heart  disease High blood pressure causes heart disease and increases the risk of stroke. This is more likely to develop in people who have high blood pressure readings, are of African descent, or are overweight. Have your blood pressure checked: Every 3-5 years if you are 18-39 years of age. Every year if you are 40 years old or older. Diabetes Have regular diabetes screenings. This checks your fasting blood sugar level. Have the screening done: Once every three years after age 40 if you are at a normal weight and have a low risk for diabetes. More often and at a younger age if you are overweight or have a high risk for diabetes. What should I know about preventing infection? Hepatitis B If you have a higher risk for hepatitis B, you should be screened for this virus. Talk with your health care provider to find out if you are at risk for hepatitis B infection. Hepatitis C Testing is recommended for: Everyone born from 1945 through 1965. Anyone with known risk factors for hepatitis C. Sexually transmitted infections (STIs) Get screened for STIs, including gonorrhea and chlamydia, if: You are sexually active and are younger than 56 years of age. You are older than 56 years of age and your health care provider tells you that you are at risk for this type of infection. Your sexual activity has changed since you were last screened, and you are at increased risk for chlamydia or gonorrhea. Ask your health care provider if you are at risk. Ask your health care provider about whether you are at high risk for HIV. Your health care provider may recommend a prescription medicine   to help prevent HIV infection. If you choose to take medicine to prevent HIV, you should first get tested for HIV. You should then be tested every 3 months for as long as you are taking the medicine. Pregnancy If you are about to stop having your period (premenopausal) and you may become pregnant, seek counseling before you get  pregnant. Take 400 to 800 micrograms (mcg) of folic acid every day if you become pregnant. Ask for birth control (contraception) if you want to prevent pregnancy. Osteoporosis and menopause Osteoporosis is a disease in which the bones lose minerals and strength with aging. This can result in bone fractures. If you are 65 years old or older, or if you are at risk for osteoporosis and fractures, ask your health care provider if you should: Be screened for bone loss. Take a calcium or vitamin D supplement to lower your risk of fractures. Be given hormone replacement therapy (HRT) to treat symptoms of menopause. Follow these instructions at home: Lifestyle Do not use any products that contain nicotine or tobacco, such as cigarettes, e-cigarettes, and chewing tobacco. If you need help quitting, ask your health care provider. Do not use street drugs. Do not share needles. Ask your health care provider for help if you need support or information about quitting drugs. Alcohol use Do not drink alcohol if: Your health care provider tells you not to drink. You are pregnant, may be pregnant, or are planning to become pregnant. If you drink alcohol: Limit how much you use to 0-1 drink a day. Limit intake if you are breastfeeding. Be aware of how much alcohol is in your drink. In the [place] one drink equals one 12 oz bottle of beer (355 mL), one 5 oz glass of wine (148 mL), or one 1 oz glass of hard liquor (44 mL). General instructions Schedule regular health, dental, and eye exams. Stay current with your vaccines. Tell your health care provider if: You often feel depressed. You have ever been abused or do not feel safe at home. Summary Adopting a healthy lifestyle and getting preventive care are important in promoting health and wellness. Follow your health care provider's instructions about healthy diet, exercising, and getting tested or screened for diseases. Follow your health care provider's  instructions on monitoring your cholesterol and blood pressure. This information is not intended to replace advice given to you by your health care provider. Make sure you discuss any questions you have with your health care provider. Document Revised: 12/31/2020 Document Reviewed: 10/16/2018 Elsevier Patient Education  2022 Elsevier Inc.  

## 2021-07-07 NOTE — Progress Notes (Signed)
Virtual Visit Consent   Kristina Ramirez, you are scheduled for a virtual visit with a [facility] provider today.     Just as with appointments in the office, your consent must be obtained to participate.  Your consent will be active for this visit and any virtual visit you may have with one of our providers in the next 365 days.     If you have a MyChart account, a copy of this consent can be sent to you electronically.  All virtual visits are billed to your insurance company just like a traditional visit in the office.    As this is a virtual visit, video technology does not allow for your provider to perform a traditional examination.  This may limit your provider's ability to fully assess your condition.  If your provider identifies any concerns that need to be evaluated in person or the need to arrange testing (such as labs, EKG, etc.), we will make arrangements to do so.     Although advances in technology are sophisticated, we cannot ensure that it will always work on either your end or our end.  If the connection with a video visit is poor, the visit may have to be switched to a telephone visit.  With either a video or telephone visit, we are not always able to ensure that we have a secure connection.     I need to obtain your verbal consent now.   Are you willing to proceed with your visit today?    Caryle Helgeson has provided verbal consent on 07/07/2021 for a virtual visit (video or telephone).   Evelina Dun, FNP   Date: 07/07/2021 2:09 PM   Virtual Visit via Video Note   I, Evelina Dun, connected with  [name]  ([id number], [birth year]) on 07/07/21 at  1:55 PM EDT by a video-enabled telemedicine application and verified that I am speaking with the correct person using two identifiers.  Location: Patient: Virtual Visit Location Patient: Home Provider: Virtual Visit Location Provider: Office/Clinic   I discussed the limitations of evaluation and management by telemedicine and the  availability of in person appointments. The patient expressed understanding and agreed to proceed.    History of Present Illness: Saidah Ramirez is a 55 y.o. who identifies as a female who was assigned female at birth, and is being seen today for chronic follow up. She has hx of NSTEMI and CHF and followed by Cardiologists. Pt states her car was impounded and does not have a way to her appointments at this time.   HPI: Headache  This is a chronic problem. The current episode started more than 1 year ago. The problem occurs intermittently. Her past medical history is significant for hypertension and obesity.  Hypertension This is a chronic problem. The current episode started more than 1 year ago. The problem has been waxing and waning since onset. The problem is uncontrolled. Associated symptoms include anxiety, headaches, malaise/fatigue and shortness of breath. Pertinent negatives include no peripheral edema. Risk factors for coronary artery disease include dyslipidemia, obesity and sedentary lifestyle. The current treatment provides moderate improvement. Hypertensive end-organ damage includes heart failure.  Congestive Heart Failure Presents for follow-up visit. Associated symptoms include edema ("some times"), fatigue, paroxysmal nocturnal dyspnea and shortness of breath. The symptoms have been stable.  Hyperlipidemia This is a chronic problem. The current episode started more than 1 year ago. Exacerbating diseases include obesity. Associated symptoms include shortness of breath. Current antihyperlipidemic treatment includes statins. The current treatment  provides moderate improvement of lipids. Risk factors for coronary artery disease include dyslipidemia, hypertension, post-menopausal and a sedentary lifestyle.  Depression        This is a chronic problem.  The current episode started more than 1 year ago.   The onset quality is gradual.   Associated symptoms include fatigue, helplessness,  hopelessness, irritable, restlessness, headaches and sad.  Associated symptoms include no suicidal ideas.  Past treatments include SSRIs - Selective serotonin reuptake inhibitors.  Past medical history includes anxiety.   Anxiety Presents for follow-up visit. Symptoms include depressed mood, excessive worry, irritability, nervous/anxious behavior, restlessness and shortness of breath. Patient reports no suicidal ideas. Symptoms occur most days. The severity of symptoms is moderate.     Problems:  Patient Active Problem List   Diagnosis Date Noted   Chronic diastolic CHF (congestive heart failure) ([place]) 04/22/2019   Depression, major, single episode, moderate ([place]) 10/21/2018   Migraine 10/21/2018   Obesity (BMI 30-39.9) 10/21/2018   Atherosclerotic heart disease of native coronary artery without angina pectoris 01/25/2015   Anxiety disorder 01/18/2015   Essential (primary) hypertension 01/18/2015   Hyperlipidemia 01/18/2015   History of non-ST elevation myocardial infarction (NSTEMI) 01/17/2015    Allergies: No Known Allergies Medications:  Current Outpatient Medications:    aspirin EC 81 MG tablet, Take 1 tablet (81 mg total) by mouth daily. (Patient not taking: Reported on 01/27/2021), Disp: 90 tablet, Rfl: 3   atorvastatin (LIPITOR) 80 MG tablet, Take 1 tablet (80 mg total) by mouth at bedtime. (NEEDS TO BE SEEN BEFORE NEXT REFILL), Disp: 30 tablet, Rfl: 0   Bacillus Coagulans-Inulin (PROBIOTIC) 1-250 BILLION-MG CAPS, Take 1 capsule by mouth daily., Disp: 90 capsule, Rfl: 1   buPROPion (WELLBUTRIN XL) 300 MG 24 hr tablet, Take 1 tablet (300 mg total) by mouth daily. (NEEDS TO BE SEEN BEFORE NEXT REFILL), Disp: 30 tablet, Rfl: 0   busPIRone (BUSPAR) 5 MG tablet, TAKE 1 TABLET BY MOUTH THREE TIMES A DAY AS NEEDED, Disp: 270 tablet, Rfl: 0   cetirizine (ZYRTEC) 10 MG tablet, TAKE 1 TABLET BY MOUTH EVERY DAY, Disp: 30 tablet, Rfl: 0   furosemide (LASIX) 20 MG tablet, TAKE 1 TABLET (20 MG  TOTAL) BY MOUTH DAILY. (NEEDS TO BE SEEN BEFORE NEXT REFILL), Disp: 90 tablet, Rfl: 3   gabapentin (NEURONTIN) 800 MG tablet, Take 1 tablet (800 mg total) by mouth 3 (three) times daily. (NEEDS TO BE SEEN BEFORE NEXT REFILL), Disp: 90 tablet, Rfl: 0   isosorbide mononitrate (IMDUR) 30 MG 24 hr tablet, TAKE 1 TABLET BY MOUTH EVERY DAY, Disp: 30 tablet, Rfl: 0   metoprolol tartrate (LOPRESSOR) 25 MG tablet, TAKE 1 TABLET (25 MG TOTAL) BY MOUTH 2 (TWO) TIMES DAILY. (NEEDS TO BE SEEN BEFORE NEXT REFILL), Disp: 60 tablet, Rfl: 0   nitroGLYCERIN (NITROSTAT) 0.4 MG SL tablet, Place 1 tablet (0.4 mg total) under the tongue every 5 (five) minutes x 3 doses as needed for chest pain (if no relief after 3rd dose, proceed to the ED for an evaluation or call 911)., Disp: 25 tablet, Rfl: 0   Olopatadine HCl 0.2 % SOLN, Apply 1 drop to eye every morning., Disp: , Rfl:    RESTASIS 0.05 % ophthalmic emulsion, Place 1 drop into both eyes 2 (two) times daily., Disp: , Rfl:    sertraline (ZOLOFT) 100 MG tablet, Take 1.5 tablets (150 mg total) by mouth daily., Disp: 135 tablet, Rfl: 0   topiramate (TOPAMAX) 100 MG tablet, Take 1 tablet (  100 mg total) by mouth 2 (two) times daily., Disp: 180 tablet, Rfl: 0   traZODone (DESYREL) 50 MG tablet, TAKE 1 TO 2 TABLETS BY MOUTH AT BEDTIME AS NEEDED FOR SLEEP, Disp: 180 tablet, Rfl: 0  Observations/Objective: Patient is well-developed, well-nourished in no acute distress.  Resting comfortably  at home.  Head is normocephalic, atraumatic.  No labored breathing.  Speech is clear and coherent with logical content.  Patient is alert and oriented at baseline.  Pt anxious   Assessment and Plan: 1. Essential (primary) hypertension - CMP14+EGFR - CBC with Differential/Platelet  2. Atherosclerosis of native coronary artery without angina pectoris, unspecified whether native or transplanted heart - CMP14+EGFR - CBC with Differential/Platelet  3. Chronic diastolic CHF (congestive  heart failure) (HCC) - CMP14+EGFR - CBC with Differential/Platelet  4. Migraine with aura and without status migrainosus, not intractable - CMP14+EGFR - CBC with Differential/Platelet  5. Generalized anxiety disorder - CMP14+EGFR - CBC with Differential/Platelet  6. Hyperlipidemia, unspecified hyperlipidemia type - CMP14+EGFR - CBC with Differential/Platelet - Lipid panel  7. Obesity (BMI 30-39.9) - CMP14+EGFR - CBC with Differential/Platelet  8. Depression, major, single episode, moderate (HCC) - CMP14+EGFR - CBC with Differential/Platelet  9. History of non-ST elevation myocardial infarction (NSTEMI) - CMP14+EGFR - CBC with Differential/Platelet  Labs pending- Pt needs to gets labs drawn or we can not continue medications.  Continue medications   Keep follow up with Cardiologists  Encourage healthy diet and exercise   Follow Up Instructions: I discussed the assessment and treatment plan with the patient. The patient was provided an opportunity to ask questions and all were answered. The patient agreed with the plan and demonstrated an understanding of the instructions.  A copy of instructions were sent to the patient via MyChart.  The patient was advised to call back or seek an in-person evaluation if the symptoms worsen or if the condition fails to improve as anticipated.  Time:  I spent 13 minutes with the patient via telehealth technology discussing the above problems/concerns.    Evelina Dun, FNP

## 2021-07-15 ENCOUNTER — Other Ambulatory Visit: Payer: Self-pay | Admitting: *Deleted

## 2021-07-15 NOTE — Patient Instructions (Signed)
Visit Information  Kristina Ramirez was given information about Medicaid Managed Care team care coordination services as a part of their Healthy [facility] Medicaid benefit. Kristina Ramirez verbally consented to engagement with the [facility] Managed Care team.   If you are experiencing a medical emergency, please call 911 or report to your local emergency department or urgent care.   If you have a non-emergency medical problem during routine business hours, please contact your provider's office and ask to speak with a nurse.   For questions related to your Healthy [facility] health plan, please call: [phone] or visit the homepage here: [email or web address]  If you would like to schedule transportation through your Healthy [facility] plan, please call the following number at least 2 days in advance of your appointment: [phone]  Call the [place] at [phone], at any time, 24 hours a day, 7 days a week. If you are in danger or need immediate medical attention call 911.  If you would like help to quit smoking, call 1-800-QUIT-NOW (469)242-4201) OR Espaol: 1-855-Djelo-Ya (2-725-366-4403) o para ms informacin haga clic aqu or Text READY to 200-400 to register via text  Kristina Ramirez - following are the goals we discussed in your visit today:   Goals Addressed             This Visit's Progress    Achieve a Healthy Weight-Obesity       Timeframe:  Long-Range Goal Priority:  High Start Date:      07/15/21                       Expected End Date:    ongoing                   Follow Up Date 08/08/21    - drink 6 to 8 glasses of water each day - join a weight loss program- call [employer] customer service number and ask about the weight management benefits they offer via Weight [employer]chers, etc - prepare main meal at home 3 to 5 days each week - set a realistic goal    Why is this important?   When you are ready to manage your weight,  have a plan and have set a goal, it is time to take action.  Taking small steps to change how you eat and exercise is a good place to start.    Notes:      Find Help in My Community       Timeframe:  Long-Range Goal Priority:  High Start Date:     07/15/21                        Expected End Date:    ongoing                   Follow Up Date 08/08/21    - follow-up on any referrals for help I am given  - work with BSW to assist me with community resources related to transportation, food, utility bills, legal aid, etc   Why is this important?   Knowing how and where to find help for yourself or family in your neighborhood and community is an important skill.  You will want to take some steps to learn how.    Notes:      Learn More About My Health       Timeframe:  Long-Range Goal Priority:  [employer]  Date:    07/15/21                         Expected End Date:  ongoing                      Follow Up Date 08/08/21    - tell my story and reason for my visit - make a list of questions - ask questions - repeat what I heard to make sure I understand - bring a list of my medicines to the visit - speak up when I don't understand -review educational material provided to me    Why is this important?   The best way to learn about your health and care is by talking to the doctor and nurse.  They will answer your questions and give you information in the way that you like best.    Notes:      Make and Keep All Appointments       Timeframe:  Long-Range Goal Priority:  High Start Date:     9/9/2                        Expected End Date:  ongoing                     Follow Up Date 08/08/21    - arrange a ride through an agency 1 week before appointment - ask family or friend for a ride - call to cancel if needed - keep a calendar with appointment dates    Why is this important?   Part of staying healthy is seeing the doctor for follow-up care.  If you forget your appointments,  there are some things you can do to stay on track.    Notes:      Manage My Emotions       Timeframe:  Long-Range Goal Priority:  High Start Date:     07/15/21                        Expected End Date:      ongoing                 Follow Up Date 08/08/21    - call Healthy [facility] Customer service and ask about benefits related to managing stress and anxiety - begin counseling or join a support group - talk about feelings with a friend, family or spiritual advisor    Why is this important?   When you are stressed, down or upset, your body reacts too.  For example, your blood pressure may get higher; you may have a headache or stomachache.  When your emotions get the best of you, your body's ability to fight off cold and flu gets weak.  These steps will help you manage your emotions.     Notes:      Track and Manage My Blood Pressure-Hypertension       Timeframe:  Long-Range Goal Priority:  High Start Date:      07/15/21                       Expected End Date:   ongoing                    Follow Up Date 08/08/21    - check blood pressure weekly  -  Review HTN educational material I receive - call my primary care provider if my home BP readings are consistently >140>90   Why is this important?   You won't feel high blood pressure, but it can still hurt your blood vessels.  High blood pressure can cause heart or kidney problems. It can also cause a stroke.  Making lifestyle changes like losing a little weight or eating less salt will help.  Checking your blood pressure at home and at different times of the day can help to control blood pressure.  If the doctor prescribes medicine remember to take it the way the doctor ordered.  Call the office if you cannot afford the medicine or if there are questions about it.     Notes:         Please see education materials related to HTN, anxiety,  HLD,  CAD, and obesity provided as print materials.   The patient verbalized understanding of  instructions provided today and agreed to receive a mailed copy of patient instruction and/or educational materials.  Telephone follow up appointment with Managed Medicaid care management team member scheduled for:08/08/21  Kelli Churn RN, CCM, [place] Management Coordinator - Managed [place] High Risk [phone]   Following is a copy of your plan of care:  Patient Care Plan: RN Care Manager Plan Of Care     Problem Identified: Chronic Disease Management and Care Coordination needs for HTN, HLD, obesity, anxiety, depression      Goal: Development of Plan Of Care For Chronic Disease Management and Care Coordination Needs To Assist With Meeting Treatment Goals   Start Date: 07/15/2021  Expected End Date: 11/12/2021  Priority: High  Note:   Current Barriers:  Knowledge Deficits related to plan of care for management of obesity, HTN, HLD, Anxiety with Excessive Worry, Panic Symptoms,, Depression: depressed mood, anhedonia,fatigue,anxiety,panic attacks,loss of energy/fatigue,, and Panic Disorder , and panic attacks  Care Coordination needs related to Financial constraints related to limited income, Limited social support, Transportation, Limited access to food, Family and relationship dysfunction, and Lacks knowledge of community resource: related to International aid/development worker.  Transportation barriers  RNCM Clinical Goal(s):  Patient will verbalize understanding of plan for management of HTN, HLD, Anxiety, Depression, and obesity take all medications exactly as prescribed and will call provider for medication related questions attend all scheduled medical appointments: once transportation resources have been provided demonstrate improved adherence to prescribed treatment plan for HTN, HLD, Anxiety, Depression, and obesity as evidenced by adherence to prescribed medication regimen contacting provider for new or worsened symptoms or questions  related to chronic disease states continue to work with RN Care Manager to address care management and care coordination needs related to CAD, HTN, HLD, Anxiety, Depression, and obesity  work with pharmacist to address Medication procurement related to CAD in investigating home delivery of low dose aspirin  work with Education officer, museum to Civil Service fast streamer constraints related to limited income, Limited social support, Transportation, Limited access to food, Family and relationship dysfunction, Social Isolation, and Lacks knowledge of community resource: legal aid related to the management of CAD, HTN, HLD, Anxiety, Depression, and obesity through collaboration with Consulting civil engineer, provider, and care team.   Interventions: Inter-disciplinary care team collaboration (see longitudinal plan of care) Evaluation of current treatment plan related to  self management and patient's adherence to plan as established by provider   Interdisciplinary Collaboration:  (Status: New goal.) Collaborated with BSW to initiate plan  of care to address needs related to Financial constraints related to limited income, Limited social support, Transportation, Limited access to food, Mental Health Concerns , Family and relationship dysfunction, Social Isolation, and Lacks knowledge of community resource: legal aid in patient with CAD, HTN, HLD, Anxiety, Depression, and obesity Collaboration with Sharion Balloon, FNP regarding development and update of comprehensive plan of care as evidenced by provider attestation and co-signature Inter-disciplinary care team collaboration (see longitudinal plan of care)  Hyperlipidemia:  (Status: New goal.) Lab Results  Component Value Date   CHOL 343 (H) 04/26/2020   HDL 45 04/26/2020   LDLCALC 183 (H) 04/26/2020   TRIG 556 ([place]) 04/26/2020   CHOLHDL 7.6 (H) 04/26/2020     Medication review performed; medication list updated in electronic medical record.  Provider established  cholesterol goals reviewed; Counseled on importance of regular laboratory monitoring as prescribed; Provided HLD educational materials; Reviewed role and benefits of statin for ASCVD risk reduction; Assessed social determinant of health barriers;   Hypertension: (Status: New goal.) Last practice recorded BP readings:  BP Readings from Last 3 Encounters:  07/26/20 135/74  06/22/20 122/78  04/26/20 (!) 149/100  Most recent eGFR/CrCl: No results found for: EGFR  No components found for: CRCL  Evaluation of current treatment plan related to hypertension self management and patient's adherence to plan as established by provider;   Provided education to patient re: stroke prevention, s/s of heart attack and stroke; Reviewed medications with patient and discussed importance of compliance;  Discussed plans with patient for ongoing care management follow up and provided patient with direct contact information for care management team; Advised patient, providing education and rationale, to monitor blood pressure daily and record, calling PCP for findings outside established parameters;  Assessed social determinant of health barriers;   Weight Loss:  (Status: New goal.) Advised patient to discuss with primary care provider options regarding weight management;  Provided patient and/or caregiver with contact information about weight management benefits offered by health plan  (community resource or dietician); Encouraged patient to contact health plan about stress management benefits for counseling and supportive care;  Assessed social determinant of health barriers;   .  Patient Goals/Self-Care Activities: Patient will self administer medications as prescribed Patient will attend all scheduled provider appointments Patient will continue to perform ADL's independently Patient will continue to perform IADL's independently Patient will call provider office for new concerns or questions Patient  will work with BSW and pharmacist to address care coordination needs and will continue to work with the clinical team to address health care and disease management related needs.

## 2021-07-15 NOTE — Patient Outreach (Signed)
Medicaid Managed Care   Nurse Care Manager Note  07/15/2021 Name:  Kristina Ramirez MRN:  [id number] DOB:  Apr 01, 1966  Kristina Ramirez is an 55 y.o. year old female who is a primary patient of Sharion Balloon, FNP.  The [facility] Managed Care Coordination team was consulted for assistance with:    CAD HTN HLD Anxiety Depression Obesity  Kristina Ramirez was given information about Medicaid Managed Care Coordination team services today. Kristina Ramirez Patient agreed to services and verbal consent obtained.  Engaged with patient by telephone for initial visit in response to provider referral for case management and/or care coordination services.   Assessments/Interventions:  Review of past medical history, allergies, medications, health status, including review of consultants reports, laboratory and other test data, was performed as part of comprehensive evaluation and provision of chronic care management services.  SDOH (Social Determinants of Health) assessments and interventions performed: SDOH Interventions    Flowsheet Row Most Recent Value  SDOH Interventions   Food Insecurity Interventions --  [referred to BSW]  Financial Strain Interventions --  [referred to BSW]  Housing Interventions --  [referred to BSW]  Intimate Partner Violence Interventions Intervention Not Indicated  Stress Interventions --  [referred to [name] for legal aid assist related to her impending divorce and property settlement]  Transportation Interventions Other (Comment)  [patient does not have a car because ex husband never gave her the care title as he was orderd to do by the court- referred to  [name]  No Known Allergies  Medications Reviewed Today     Reviewed by [name], RN (Registered Nurse) on 07/15/21 at [place] List Status: <None>   Medication Order Taking? Sig Documenting Provider Last Dose Status Informant  aspirin EC 81 MG tablet [id number] No Take 1 tablet (81 mg total) by mouth daily.   Patient not taking: No sig reported   Herminio Commons, MD Not Taking Active   atorvastatin (LIPITOR) 80 MG tablet [id number] Yes Take 1 tablet (80 mg total) by mouth at bedtime. (NEEDS TO BE SEEN BEFORE NEXT REFILL) Sharion Balloon, FNP Taking Active   Bacillus Coagulans-Inulin (PROBIOTIC) 1-250 BILLION-MG CAPS [id number] Yes Take 1 capsule by mouth daily. Sharion Balloon, FNP Taking Active   buPROPion (WELLBUTRIN XL) 300 MG 24 hr tablet [id number] Yes Take 1 tablet (300 mg total) by mouth daily. (NEEDS TO BE SEEN BEFORE NEXT REFILL) Sharion Balloon, FNP Taking Active   busPIRone (BUSPAR) 5 MG tablet [id number] Yes TAKE 1 TABLET BY MOUTH THREE TIMES A DAY AS NEEDED Sharion Balloon, FNP Taking Active            Med Note Broadus John, Trude Mcburney   Fri Jul 15, 2021 10:16 AM) Taking twice a day  cetirizine (ZYRTEC) 10 MG tablet [id number] Yes TAKE 1 TABLET BY MOUTH EVERY DAY Hawks, Christy A, FNP Taking Active   furosemide (LASIX) 20 MG tablet [id number] Yes TAKE 1 TABLET (20 MG TOTAL) BY MOUTH DAILY. (NEEDS TO BE SEEN BEFORE NEXT REFILL) Verta Ellen., NP Taking Active   gabapentin (NEURONTIN) 800 MG tablet [id number] Yes Take 1 tablet (800 mg total) by mouth 3 (three) times daily. (NEEDS TO BE SEEN BEFORE NEXT REFILL) Sharion Balloon, FNP Taking Active   isosorbide mononitrate (IMDUR) 30 MG 24 hr tablet [id number] No TAKE 1 TABLET BY MOUTH EVERY DAY  Patient not taking: Reported on 07/15/2021   Verta Ellen.,  NP Not Taking Active   metoprolol tartrate (LOPRESSOR) 25 MG tablet [id number] Yes TAKE 1 TABLET (25 MG TOTAL) BY MOUTH 2 (TWO) TIMES DAILY. (NEEDS TO BE SEEN BEFORE NEXT REFILL) Sharion Balloon, FNP Taking Active   nitroGLYCERIN (NITROSTAT) 0.4 MG SL tablet [id number] Yes Place 1 tablet (0.4 mg total) under the tongue every 5 (five) minutes x 3 doses as needed for chest pain (if no relief after 3rd dose, proceed to the ED for an evaluation or call 911). Verta Ellen., NP Taking Active    Olopatadine HCl 0.2 % SOLN [id number] Yes Apply 1 drop to eye every morning. [provider] Taking Active   RESTASIS 0.05 % ophthalmic emulsion [id number] Yes Place 1 drop into both eyes 2 (two) times daily. [provider] Taking Active   sertraline (ZOLOFT) 100 MG tablet [id number] Yes Take 1.5 tablets (150 mg total) by mouth daily. Sharion Balloon, FNP Taking Active   topiramate (TOPAMAX) 100 MG tablet [id number] Yes Take 1 tablet (100 mg total) by mouth 2 (two) times daily. Sharion Balloon, FNP Taking Active   traZODone (DESYREL) 50 MG tablet [id number] Yes TAKE 1 TO 2 TABLETS BY MOUTH AT BEDTIME AS NEEDED FOR SLEEP Sharion Balloon, FNP Taking Active             Patient Active Problem List   Diagnosis Date Noted   Chronic diastolic CHF (congestive heart failure) ([place]) 04/22/2019   Depression, major, single episode, moderate (HCC) 10/21/2018   Migraine 10/21/2018   Obesity (BMI 30-39.9) 10/21/2018   Atherosclerotic heart disease of native coronary artery without angina pectoris 01/25/2015   Anxiety disorder 01/18/2015   Essential (primary) hypertension 01/18/2015   Hyperlipidemia 01/18/2015   History of non-ST elevation myocardial infarction (NSTEMI) 01/17/2015    Conditions to be addressed/monitored per PCP order:  CAD, HTN, HLD, Anxiety, Depression, and obesity  Care Plan : [place]  Updates made by [name], RN since 07/15/2021 12:00 AM     Problem: Chronic Disease Management and Care Coordination needs for HTN, HLD, obesity, anxiety, depression      Goal: Development of Plan Of Care For Chronic Disease Management and Care Coordination Needs To Assist With Meeting Treatment Goals   Start Date: 07/15/2021  Expected End Date: 11/12/2021  Priority: High  Note:   Current Barriers:  Knowledge Deficits related to plan of care for management of obesity, HTN, HLD, Anxiety with Excessive Worry, Panic Symptoms,, Depression: depressed mood,  anhedonia,fatigue,anxiety,panic attacks,loss of energy/fatigue,, and Panic Disorder , and panic attacks  Care Coordination needs related to Financial constraints related to limited income, Limited social support, Transportation, Limited access to food, Family and relationship dysfunction, and Lacks knowledge of community resource: related to International aid/development worker.  Transportation barriers  RNCM Clinical Goal(s):  Patient will verbalize understanding of plan for management of HTN, HLD, Anxiety, Depression, and obesity take all medications exactly as prescribed and will call provider for medication related questions attend all scheduled medical appointments: once transportation resources have been provided demonstrate improved adherence to prescribed treatment plan for HTN, HLD, Anxiety, Depression, and obesity as evidenced by adherence to prescribed medication regimen contacting provider for new or worsened symptoms or questions related to chronic disease states continue to work with RN Care Manager to address care management and care coordination needs related to CAD, HTN, HLD, Anxiety, Depression, and obesity  work with pharmacist to address Medication procurement related to CAD in  investigating home delivery of low dose aspirin  work with Education officer, museum to Civil Service fast streamer constraints related to limited income, Limited social support, Transportation, Limited access to food, Family and relationship dysfunction, Environmental consultant, and Lacks knowledge of community resource: legal aid related to the management of CAD, HTN, HLD, Anxiety, Depression, and obesity through collaboration with Consulting civil engineer, provider, and care team.   Interventions: Inter-disciplinary care team collaboration (see longitudinal plan of care) Evaluation of current treatment plan related to  self management and patient's adherence to plan as established by provider   Interdisciplinary Collaboration:  (Status: New  goal.) Collaborated with BSW to initiate plan of care to address needs related to Financial constraints related to limited income, Limited social support, Transportation, Limited access to food, Mental Health Concerns , Family and relationship dysfunction, Social Isolation, and Lacks knowledge of community resource: legal aid in patient with CAD, HTN, HLD, Anxiety, Depression, and obesity Collaboration with Sharion Balloon, FNP regarding development and update of comprehensive plan of care as evidenced by provider attestation and co-signature Inter-disciplinary care team collaboration (see longitudinal plan of care)  Hyperlipidemia:  (Status: New goal.) Lab Results  Component Value Date   CHOL 343 (H) 04/26/2020   HDL 45 04/26/2020   LDLCALC 183 (H) 04/26/2020   TRIG 556 (HH) 04/26/2020   CHOLHDL 7.6 (H) 04/26/2020     Medication review performed; medication list updated in electronic medical record.  Provider established cholesterol goals reviewed; Counseled on importance of regular laboratory monitoring as prescribed; Provided HLD educational materials; Reviewed role and benefits of statin for ASCVD risk reduction; Assessed social determinant of health barriers;   Hypertension: (Status: New goal.) Last practice recorded BP readings:  BP Readings from Last 3 Encounters:  07/26/20 135/74  06/22/20 122/78  04/26/20 (!) 149/100  Most recent eGFR/CrCl: No results found for: EGFR  No components found for: CRCL  Evaluation of current treatment plan related to hypertension self management and patient's adherence to plan as established by provider;   Provided education to patient re: stroke prevention, s/s of heart attack and stroke; Reviewed medications with patient and discussed importance of compliance;  Discussed plans with patient for ongoing care management follow up and provided patient with direct contact information for care management team; Advised patient, providing education  and rationale, to monitor blood pressure daily and record, calling PCP for findings outside established parameters;  Assessed social determinant of health barriers;   Weight Loss:  (Status: New goal.) Advised patient to discuss with primary care provider options regarding weight management;  Provided patient and/or caregiver with contact information about weight management benefits offered by health plan  (community resource or dietician); Encouraged patient to contact health plan about stress management benefits for counseling and supportive care;  Assessed social determinant of health barriers;   .  Patient Goals/Self-Care Activities: Patient will self administer medications as prescribed Patient will attend all scheduled provider appointments Patient will continue to perform ADL's independently Patient will continue to perform IADL's independently Patient will call provider office for new concerns or questions Patient will work with BSW and pharmacist to address care coordination needs and will continue to work with the clinical team to address health care and disease management related needs.         Follow Up:  Patient agrees to Care Plan and Follow-up.  Plan: The Managed Medicaid care management team will reach out to the patient again over the next 30 days.  Date/time of next scheduled RN care  management/care coordination outreach:  08/08/21  Kelli Churn RN, CCM, [facility] Care Management Coordinator - Managed [place] High Risk [phone]

## 2021-07-22 ENCOUNTER — Other Ambulatory Visit: Payer: Self-pay

## 2021-07-22 NOTE — Patient Instructions (Signed)
Visit Information  Ms. Kristina Ramirez  - as a part of your Medicaid benefit, you are eligible for care management and care coordination services at no cost or copay. I was unable to reach you by phone today but would be happy to help you with your health related needs. Please feel free to call me @ [phone].   A member of the Managed Medicaid care management team will reach out to you again over the next 7 days.   Aryan Bello, BSW, MHA [facility]  [place] Managed Medicaid Team  [phone]  

## 2021-07-22 NOTE — Patient Outreach (Signed)
Care Coordination  07/22/2021  [name] [birth year] [id number]   Medicaid Managed Care   Unsuccessful Outreach Note  07/22/2021 Name: Kristina Ramirez MRN: [id number] DOB: 1966-06-10  Referred by: Junie Spencer, FNP Reason for referral : High Risk Managed Medicaid (MM Social work unsuccessful telephone outreach)   An unsuccessful telephone outreach was attempted today. The patient was referred to the case management team for assistance with care management and care coordination.   Follow Up Plan: The care management team will reach out to the patient again over the next 7 days.   Gus Puma, BSW, [place] [facility]  [employer] Risk Managed Medicaid Team  [phone]

## 2021-07-29 ENCOUNTER — Other Ambulatory Visit: Payer: Self-pay

## 2021-07-29 NOTE — Patient Outreach (Signed)
07/29/2021 Name: Bo Rogue MRN: [id number] DOB: [birth year]  Referred by: Junie Spencer, FNP Reason for referral : No chief complaint on file.   An unsuccessful telephone outreach was attempted today. The patient was referred to the case management team for assistance with care management and care coordination.    Follow Up Plan: The Managed Medicaid care management team will reach out to the patient again over the next 10 days. Unable to leave voicemail - voicemail box full.  Cheral Almas PharmD, CPP High Risk Managed Medicaid [place] [phone]

## 2021-08-02 ENCOUNTER — Other Ambulatory Visit: Payer: Self-pay

## 2021-08-02 NOTE — Patient Outreach (Signed)
Care Coordination  08/02/2021  Kristina Ramirez [birth year] [id number]   Medicaid Managed Care   Unsuccessful Outreach Note  08/02/2021 Name: Kristina Ramirez MRN: [id number] DOB: Mar 06, 1966  Referred by: Junie Spencer, FNP Reason for referral : High Risk Managed Medicaid (MM Social Work Unsuccessful [employer])   A second unsuccessful telephone outreach was attempted today. The patient was referred to the case management team for assistance with care management and care coordination.   Follow Up Plan: The care management team will reach out to the patient again over the next 7 days.   Gus Puma, BSW, [place] [facility]  [employer] Risk Managed Medicaid Team  [phone]

## 2021-08-02 NOTE — Patient Instructions (Signed)
Visit Information  Ms. Shelly Flatten  - as a part of your Medicaid benefit, you are eligible for care management and care coordination services at no cost or copay. I was unable to reach you by phone today but would be happy to help you with your health related needs. Please feel free to call me @ [phone].   A member of the Managed Medicaid care management team will reach out to you again over the next 7 days.   Gus Puma, BSW, [place] [facility]  [employer] Risk Managed Medicaid Team  [phone]

## 2021-08-08 ENCOUNTER — Ambulatory Visit: Payer: Self-pay

## 2021-08-08 ENCOUNTER — Telehealth: Payer: Self-pay | Admitting: *Deleted

## 2021-08-08 NOTE — Patient Instructions (Signed)
Shelly Flatten ,   The [facility] Managed Care Team is available to provide assistance to you with your healthcare needs at no cost and as a benefit of your [facility] Health plan. I'm sorry I was unable to reach you today for our scheduled appointment. Our care guide will call you to reschedule our telephone appointment. Please call me at the number below. I am available to be of assistance to you regarding your healthcare needs. .   Thank you,   Cranford Mon RN, CCM, CDCES [place]  [facility] Care Management Coordinator - Managed [place] High Risk [phone]

## 2021-08-08 NOTE — Patient Outreach (Signed)
Care Coordination  08/08/2021  Kristina Ramirez 1966/06/21 [id number]  08/08/2021 Name: Kristina Ramirez MRN: [id number] DOB: 04-15-1966  Referred by: Junie Spencer, FNP Reason for referral : High Risk Managed Medicaid (Unsuccessful RNCM follow up telephone outreach)   An unsuccessful telephone outreach was attempted today. The patient was referred to the case management team for assistance with care management and care coordination.   The purpose of today's call was to complete the first follow up assessment. Unable to leave message; recording received on patient's mobile number states voice mailbox is full. Attempted to call patient's listed home number, [phone],  and recording received states the number is not a working number.   Follow Up Plan: The Managed Medicaid care management team will reach out to the patient again over the next 7 days.    Cranford Mon RN, CCM, CDCES   [facility] Care Management Coordinator - Managed [place] High Risk [phone]

## 2021-08-09 ENCOUNTER — Telehealth: Payer: Self-pay | Admitting: Family

## 2021-08-09 NOTE — Telephone Encounter (Signed)
..   Medicaid Managed Care   Unsuccessful Outreach Note  08/09/2021 Name: Kristina Ramirez MRN: [id number] DOB: 09-20-1966  Referred by: Junie Spencer, FNP Reason for referral : High Risk Managed Medicaid (I called the patient today to get her phone visits with the [facility] RNCM and the Pharmacist rescheduled. She did not answer and her VM was full.)   An unsuccessful telephone outreach was attempted today. The patient was referred to the case management team for assistance with care management and care coordination.   Follow Up Plan: The care management team will reach out to the patient again over the next 7-14 days.   Weston Settle Care Guide, High Risk Medicaid Managed Care Embedded Care Coordination [facility]  [facility]

## 2021-08-19 ENCOUNTER — Ambulatory Visit: Payer: Self-pay

## 2021-08-19 ENCOUNTER — Other Ambulatory Visit: Payer: Self-pay | Admitting: *Deleted

## 2021-08-19 NOTE — Patient Outreach (Signed)
Care Coordination  08/19/2021  Kristina Ramirez October 13, 1966 [id number]  08/19/2021 Name: Kristina Ramirez MRN: [id number] DOB: [birth year]  Referred by: Junie Spencer, FNP Reason for referral : High Risk Managed Medicaid (Unsuccessful RNCM follow up telephone outreach)   A second unsuccessful telephone outreach was attempted today. The patient was referred to the case management team for assistance with care management and care coordination.  Unable to leave message as recording states voice mail box is full.  Managed Medicaid team members Gus Puma BSW attempted to reach patient unsuccessfully on 07/22/21 and 08/02/21 and Morton Peters D attempted to reach patient unsuccessfully on 07/29/21.  Plan: Will consult with Marja Kays RN High-Risk Medicaid Managed Care/Lead Clinical Supervisor regarding further attempts in contacting patient for follow up.    Cranford Mon RN, CCM, CDCES [place]  [facility] Care Management Coordinator - Managed [place] High Risk [phone]

## 2021-08-22 ENCOUNTER — Other Ambulatory Visit: Payer: Self-pay | Admitting: *Deleted

## 2021-08-22 ENCOUNTER — Ambulatory Visit: Payer: Medicaid Other

## 2021-08-22 NOTE — Patient Instructions (Signed)
Shelly Flatten ,   The [facility] Managed Care Team is available to provide assistance to you with your healthcare needs at no cost and as a benefit of your [facility] Health plan. We have been unable to reach you on 3 separate attempts. The care management team is available to assist with your healthcare needs at any time. Please do not hesitate to contact me at the number below. .   Thank you,   Cranford Mon RN, CCM, CDCES [place]  [facility] Care Management Coordinator - Managed [place] High Risk [phone]

## 2021-08-22 NOTE — Patient Outreach (Addendum)
Care Coordination  08/22/2021  Kristina Ramirez June 23, 1966 [id number]  08/22/2021 Name: Kristina Ramirez MRN: [id number] DOB: 10-12-1966  Referred by: Junie Spencer, FNP Reason for referral : High Risk Managed Medicaid (Case closure- unable to maintain contact with patient)   Third unsuccessful telephone outreach was attempted today. The patient was referred to the case management team for assistance with care management and care coordination. The patient's primary care provider has been notified of our unsuccessful attempts to make or maintain contact with the patient. The care management team is pleased to engage with this patient at any time in the future should he/she be interested in assistance from the care management team.    Follow Up Plan: The  Patient has been provided with contact information for the Managed Medicaid care management team and has been advised to call with any health related questions or concerns. and The Managed Medicaid care management team is available to follow up with the patient after provider conversation with the patient regarding recommendation for care management engagement and subsequent re-referral to the care management team.     Cranford Mon RN, CCM, CDCES [place]  [facility] Care Management Coordinator - Managed [place] High Risk [phone]

## 2021-09-07 ENCOUNTER — Other Ambulatory Visit: Payer: Self-pay | Admitting: Family

## 2021-09-07 ENCOUNTER — Other Ambulatory Visit: Payer: Self-pay | Admitting: Family Medicine

## 2021-09-07 DIAGNOSIS — F321 Major depressive disorder, single episode, moderate: Secondary | ICD-10-CM

## 2021-09-07 DIAGNOSIS — E785 Hyperlipidemia, unspecified: Secondary | ICD-10-CM

## 2021-09-07 DIAGNOSIS — G43109 Migraine with aura, not intractable, without status migrainosus: Secondary | ICD-10-CM

## 2021-09-07 DIAGNOSIS — F411 Generalized anxiety disorder: Secondary | ICD-10-CM

## 2021-10-03 ENCOUNTER — Telehealth: Payer: Self-pay | Admitting: Family

## 2021-10-03 NOTE — Telephone Encounter (Signed)
Called number listed not working

## 2021-10-04 NOTE — Telephone Encounter (Signed)
Appt moved to Friday patient aware

## 2021-10-07 ENCOUNTER — Telehealth: Payer: Medicaid Other | Admitting: Family

## 2021-10-11 ENCOUNTER — Ambulatory Visit: Payer: Medicaid Other | Admitting: Family

## 2021-10-12 ENCOUNTER — Encounter: Payer: Self-pay | Admitting: Family

## 2021-10-19 ENCOUNTER — Ambulatory Visit: Payer: Medicaid Other | Admitting: Family

## 2021-11-03 ENCOUNTER — Ambulatory Visit: Payer: Medicaid Other | Admitting: Family

## 2021-11-08 ENCOUNTER — Ambulatory Visit: Payer: Medicaid Other | Admitting: Family

## 2021-11-09 ENCOUNTER — Encounter: Payer: Self-pay | Admitting: Family

## 2022-04-06 ENCOUNTER — Encounter: Payer: Self-pay | Admitting: Family

## 2022-04-06 ENCOUNTER — Ambulatory Visit: Payer: Medicaid Other | Admitting: Family

## 2022-04-06 VITALS — BP 153/93 | HR 84 | Temp 98.6°F | Ht 63.0 in | Wt 167.8 lb

## 2022-04-06 DIAGNOSIS — I10 Essential (primary) hypertension: Secondary | ICD-10-CM

## 2022-04-06 DIAGNOSIS — I2510 Atherosclerotic heart disease of native coronary artery without angina pectoris: Secondary | ICD-10-CM | POA: Diagnosis not present

## 2022-04-06 DIAGNOSIS — E785 Hyperlipidemia, unspecified: Secondary | ICD-10-CM

## 2022-04-06 DIAGNOSIS — E663 Overweight: Secondary | ICD-10-CM | POA: Diagnosis not present

## 2022-04-06 DIAGNOSIS — F321 Major depressive disorder, single episode, moderate: Secondary | ICD-10-CM | POA: Diagnosis not present

## 2022-04-06 DIAGNOSIS — G4700 Insomnia, unspecified: Secondary | ICD-10-CM | POA: Diagnosis not present

## 2022-04-06 DIAGNOSIS — I5032 Chronic diastolic (congestive) heart failure: Secondary | ICD-10-CM

## 2022-04-06 DIAGNOSIS — F411 Generalized anxiety disorder: Secondary | ICD-10-CM | POA: Diagnosis not present

## 2022-04-06 DIAGNOSIS — Z0001 Encounter for general adult medical examination with abnormal findings: Secondary | ICD-10-CM

## 2022-04-06 DIAGNOSIS — I252 Old myocardial infarction: Secondary | ICD-10-CM | POA: Diagnosis not present

## 2022-04-06 DIAGNOSIS — G43109 Migraine with aura, not intractable, without status migrainosus: Secondary | ICD-10-CM | POA: Diagnosis not present

## 2022-04-06 DIAGNOSIS — Z0000 Encounter for general adult medical examination without abnormal findings: Secondary | ICD-10-CM | POA: Diagnosis not present

## 2022-04-06 MED ORDER — TOPIRAMATE 100 MG PO TABS: 100.0000 mg | ORAL_TABLET | Freq: Two times a day (BID) | ORAL | 0 refills | Status: DC

## 2022-04-06 MED ORDER — BUSPIRONE HCL 5 MG PO TABS: 5.0000 mg | ORAL_TABLET | Freq: Three times a day (TID) | ORAL | 1 refills | Status: DC | PRN

## 2022-04-06 MED ORDER — SERTRALINE HCL 100 MG PO TABS: ORAL_TABLET | ORAL | 0 refills | Status: DC

## 2022-04-06 MED ORDER — METOPROLOL TARTRATE 25 MG PO TABS: 25.0000 mg | ORAL_TABLET | Freq: Two times a day (BID) | ORAL | 0 refills | Status: DC

## 2022-04-06 MED ORDER — TRAZODONE HCL 50 MG PO TABS: 50.0000 mg | ORAL_TABLET | Freq: Every evening | ORAL | 0 refills | Status: DC | PRN

## 2022-04-06 MED ORDER — ATORVASTATIN CALCIUM 80 MG PO TABS: 80.0000 mg | ORAL_TABLET | Freq: Every day | ORAL | 0 refills | Status: DC

## 2022-04-06 MED ORDER — GABAPENTIN 800 MG PO TABS: 800.0000 mg | ORAL_TABLET | Freq: Three times a day (TID) | ORAL | 1 refills | Status: DC

## 2022-04-06 MED ORDER — BUPROPION HCL ER (XL) 300 MG PO TB24: 300.0000 mg | ORAL_TABLET | Freq: Every day | ORAL | 0 refills | Status: DC

## 2022-04-06 MED ORDER — FUROSEMIDE 20 MG PO TABS: 20.0000 mg | ORAL_TABLET | Freq: Every day | ORAL | 3 refills | Status: DC

## 2022-04-06 NOTE — Progress Notes (Signed)
Subjective:    Patient ID: Kristina Ramirez, female    DOB: 1966-05-01, 56 y.o.   MRN: [id number]  Chief Complaint  Patient presents with   Medication Refill   Pt presents to the office today for CPE and chronic follow up. Pt has not been seen in the office in several years. She has hx of NSTEMI and CHF, but has not seen Cardiologists in years.  Pt states her car was impounded and does not have a way to her appointments at this time. She has been kicked out of her apartment a month ago. She reports she has been staying with a friend. She is crying.   She has not taken any medications in a month.   She has atherosclerotic heart disease and was taking Lipitor.  Hypertension This is a chronic problem. The current episode started more than 1 year ago. The problem has been waxing and waning since onset. The problem is uncontrolled. Associated symptoms include anxiety, headaches and malaise/fatigue. Pertinent negatives include no peripheral edema or shortness of breath. Risk factors for coronary artery disease include dyslipidemia, obesity and sedentary lifestyle. Past treatments include beta blockers. The current treatment provides moderate improvement. Hypertensive end-organ damage includes CAD/MI and heart failure.  Congestive Heart Failure Presents for follow-up visit. Associated symptoms include fatigue. Pertinent negatives include no edema, near-syncope or shortness of breath. The symptoms have been stable.  Hyperlipidemia This is a chronic problem. The current episode started more than 1 year ago. The problem is controlled. Recent lipid tests were reviewed and are normal. Pertinent negatives include no shortness of breath. Current antihyperlipidemic treatment includes statins. The current treatment provides moderate improvement of lipids. Risk factors for coronary artery disease include dyslipidemia, hypertension and a sedentary lifestyle.  Depression        This is a chronic problem.  The current  episode started more than 1 year ago.   The onset quality is gradual.   The problem occurs intermittently.  Associated symptoms include fatigue, irritable, restlessness, headaches and sad.  Associated symptoms include no helplessness and no hopelessness.  Past medical history includes anxiety.   Anxiety Presents for follow-up visit. Symptoms include excessive worry, irritability, nervous/anxious behavior and restlessness. Patient reports no shortness of breath. Symptoms occur occasionally. The severity of symptoms is moderate. The quality of sleep is good.    Headache  This is a chronic problem. The current episode started more than 1 year ago. The problem occurs daily. The quality of the pain is described as aching. The pain is moderate. She has tried beta blockers for the symptoms. The treatment provided mild relief. Her past medical history is significant for hypertension.     Review of Systems  Constitutional:  Positive for fatigue, irritability and malaise/fatigue.  Respiratory:  Negative for shortness of breath.   Cardiovascular:  Negative for near-syncope.  Neurological:  Positive for headaches.  Psychiatric/Behavioral:  Positive for depression. The patient is nervous/anxious.   All other systems reviewed and are negative.  Family History  Problem Relation Age of Onset   CAD Father    Social History   Socioeconomic History   Marital status: Divorced    Spouse name: Not on file   Number of children: Not on file   Years of education: Not on file   Highest education level: Not on file  Occupational History   Not on file  Tobacco Use   Smoking status: Former    Types: Cigarettes    Quit date: 12/05/2013  Years since quitting: 8.3   Smokeless tobacco: Never  Vaping Use   Vaping Use: Never used  Substance and Sexual Activity   Alcohol use: Never   Drug use: Never   Sexual activity: Yes  Other Topics Concern   Not on file  Social History Narrative   Not on file    Social Determinants of Health   Financial Resource Strain: High Risk   Difficulty of Paying Living Expenses: Very hard  Food Insecurity: Food Insecurity Present   Worried About Charity fundraiser in the Last Year: Often true   Arboriculturist in the Last Year: Often true  Transportation Needs: Unmet Transportation Needs   Lack of Transportation (Medical): Yes   Lack of Transportation (Non-Medical): Yes  Physical Activity: Not on file  Stress: Stress Concern Present   Feeling of Stress : Very much  Social Connections: Not on file       Objective:   Physical Exam Vitals reviewed.  Constitutional:      General: She is irritable. She is not in acute distress.    Appearance: She is well-developed. She is obese.  HENT:     Head: Normocephalic and atraumatic.     Right Ear: Tympanic membrane normal.     Left Ear: Tympanic membrane normal.  Eyes:     Pupils: Pupils are equal, round, and reactive to light.  Neck:     Thyroid: No thyromegaly.  Cardiovascular:     Rate and Rhythm: Normal rate and regular rhythm.     Heart sounds: Normal heart sounds. No murmur heard. Pulmonary:     Effort: Pulmonary effort is normal. No respiratory distress.     Breath sounds: Normal breath sounds. No wheezing.  Abdominal:     General: Bowel sounds are normal. There is no distension.     Palpations: Abdomen is soft.     Tenderness: There is no abdominal tenderness.  Musculoskeletal:        General: No tenderness. Normal range of motion.     Cervical back: Normal range of motion and neck supple.  Skin:    General: Skin is warm and dry.  Neurological:     Mental Status: She is alert and oriented to person, place, and time.     Cranial Nerves: No cranial nerve deficit.     Deep Tendon Reflexes: Reflexes are normal and symmetric.  Psychiatric:        Mood and Affect: Mood is anxious. Affect is tearful.        Behavior: Behavior normal.        Thought Content: Thought content normal.         Judgment: Judgment is impulsive.      BP (!) 153/93   Pulse 84   Temp 98.6 F (37 C)   Ht 5' 3"  (1.6 m)   Wt 167 lb 12.8 oz (76.1 kg)   BMI 29.72 kg/m      Assessment & Plan:  Kristina Ramirez comes in today with chief complaint of Medication Refill   Diagnosis and orders addressed:  1. Hyperlipidemia, unspecified hyperlipidemia type - atorvastatin (LIPITOR) 80 MG tablet; Take 1 tablet (80 mg total) by mouth daily.  Dispense: 90 tablet; Refill: 0 - CMP14+EGFR - CBC with Differential/Platelet  2. Generalized anxiety disorder - buPROPion (WELLBUTRIN XL) 300 MG 24 hr tablet; Take 1 tablet (300 mg total) by mouth daily. (NEEDS TO BE SEEN BEFORE NEXT REFILL)  Dispense: 30 tablet; Refill: 0 - sertraline (  ZOLOFT) 100 MG tablet; TAKE 1 AND 1/2 TABLETS(150 MG TOTAL) DAILY  Dispense: 135 tablet; Refill: 0 - CMP14+EGFR - CBC with Differential/Platelet - busPIRone (BUSPAR) 5 MG tablet; Take 1 tablet (5 mg total) by mouth 3 (three) times daily as needed.  Dispense: 90 tablet; Refill: 1  3. Depression, major, single episode, moderate (HCC) - buPROPion (WELLBUTRIN XL) 300 MG 24 hr tablet; Take 1 tablet (300 mg total) by mouth daily. (NEEDS TO BE SEEN BEFORE NEXT REFILL)  Dispense: 30 tablet; Refill: 0 - sertraline (ZOLOFT) 100 MG tablet; TAKE 1 AND 1/2 TABLETS(150 MG TOTAL) DAILY  Dispense: 135 tablet; Refill: 0 - CMP14+EGFR - CBC with Differential/Platelet - busPIRone (BUSPAR) 5 MG tablet; Take 1 tablet (5 mg total) by mouth 3 (three) times daily as needed.  Dispense: 90 tablet; Refill: 1  4. Essential (primary) hypertension - furosemide (LASIX) 20 MG tablet; Take 1 tablet (20 mg total) by mouth daily. (Needs to be seen before next refill)  Dispense: 90 tablet; Refill: 3 - metoprolol tartrate (LOPRESSOR) 25 MG tablet; Take 1 tablet (25 mg total) by mouth 2 (two) times daily. (NEEDS TO BE SEEN BEFORE NEXT REFILL)  Dispense: 60 tablet; Refill: 0 - CMP14+EGFR - CBC with  Differential/Platelet  5. Migraine with aura and without status migrainosus, not intractable - topiramate (TOPAMAX) 100 MG tablet; Take 1 tablet (100 mg total) by mouth 2 (two) times daily.  Dispense: 180 tablet; Refill: 0 - CMP14+EGFR - CBC with Differential/Platelet  6. Chronic diastolic CHF (congestive heart failure) (HCC) - CMP14+EGFR - CBC with Differential/Platelet  7. Atherosclerosis of native coronary artery without angina pectoris, unspecified whether native or transplanted heart  - CMP14+EGFR - CBC with Differential/Platelet  8. History of non-ST elevation myocardial infarction (NSTEMI) - CMP14+EGFR - CBC with Differential/Platelet  9. Overweight (BMI 25.0-29.9) - CMP14+EGFR - CBC with Differential/Platelet  10. Annual physical exam - CMP14+EGFR - CBC with Differential/Platelet - Lipid panel - TSH  11. Insomnia, unspecified type - traZODone (DESYREL) 50 MG tablet; Take 1-2 tablets (50-100 mg total) by mouth at bedtime as needed. for sleep  Dispense: 180 tablet; Refill: 0 - CMP14+EGFR - CBC with Differential/Platelet   Labs pending Health Maintenance reviewed Diet and exercise encouraged  Follow up plan: 1 month after restarting all medications.   Evelina Dun, FNP

## 2022-04-06 NOTE — Patient Instructions (Addendum)

## 2022-04-07 LAB — LIPID PANEL
Chol/HDL Ratio: 7.1 ratio — ABNORMAL HIGH (ref 0.0–4.4)
Cholesterol, Total: 262 mg/dL — ABNORMAL HIGH (ref 100–199)
HDL: 37 mg/dL — ABNORMAL LOW (ref >39)
LDL Chol Calc (NIH): 155 mg/dL — ABNORMAL HIGH (ref 0–99)
Triglycerides: 370 mg/dL — ABNORMAL HIGH (ref 0–149)
VLDL Cholesterol Cal: 70 mg/dL — ABNORMAL HIGH (ref 5–40)

## 2022-04-07 LAB — CBC WITH DIFFERENTIAL/PLATELET
Basophils Absolute: 0.1 10*3/uL (ref 0.0–0.2)
Basos: 1 %
EOS (ABSOLUTE): 0.2 10*3/uL (ref 0.0–0.4)
Eos: 2 %
Hematocrit: 38.4 % (ref 34.0–46.6)
Hemoglobin: 12.5 g/dL (ref 11.1–15.9)
Immature Grans (Abs): 0 10*3/uL (ref 0.0–0.1)
Immature Granulocytes: 0 %
Lymphocytes Absolute: 2.4 10*3/uL (ref 0.7–3.1)
Lymphs: 28 %
MCH: 25.6 pg — ABNORMAL LOW (ref 26.6–33.0)
MCHC: 32.6 g/dL (ref 31.5–35.7)
MCV: 79 fL (ref 79–97)
Monocytes Absolute: 0.5 10*3/uL (ref 0.1–0.9)
Monocytes: 6 %
Neutrophils Absolute: 5.2 10*3/uL (ref 1.4–7.0)
Neutrophils: 63 %
Platelets: 294 10*3/uL (ref 150–450)
RBC: 4.88 x10E6/uL (ref 3.77–5.28)
RDW: 14.4 % (ref 11.7–15.4)
WBC: 8.4 10*3/uL (ref 3.4–10.8)

## 2022-04-07 LAB — CMP14+EGFR
ALT: 10 IU/L (ref 0–32)
AST: 13 IU/L (ref 0–40)
Albumin/Globulin Ratio: 1.9 (ref 1.2–2.2)
Albumin: 4.6 g/dL (ref 3.8–4.9)
Alkaline Phosphatase: 110 IU/L (ref 44–121)
BUN/Creatinine Ratio: 15 (ref 9–23)
BUN: 15 mg/dL (ref 6–24)
Bilirubin Total: 0.2 mg/dL (ref 0.0–1.2)
CO2: 18 mmol/L — ABNORMAL LOW (ref 20–29)
Calcium: 9.5 mg/dL (ref 8.7–10.2)
Chloride: 104 mmol/L (ref 96–106)
Creatinine, Ser: 0.98 mg/dL (ref 0.57–1.00)
Globulin, Total: 2.4 g/dL (ref 1.5–4.5)
Glucose: 111 mg/dL — ABNORMAL HIGH (ref 70–99)
Potassium: 3.4 mmol/L — ABNORMAL LOW (ref 3.5–5.2)
Sodium: 139 mmol/L (ref 134–144)
Total Protein: 7 g/dL (ref 6.0–8.5)
eGFR: 68 mL/min/{1.73_m2} (ref >59)

## 2022-04-07 LAB — TSH: TSH: 4.94 u[IU]/mL — ABNORMAL HIGH (ref 0.450–4.500)

## 2022-04-28 ENCOUNTER — Other Ambulatory Visit: Payer: Self-pay | Admitting: Family

## 2022-04-28 DIAGNOSIS — F411 Generalized anxiety disorder: Secondary | ICD-10-CM

## 2022-04-28 DIAGNOSIS — F321 Major depressive disorder, single episode, moderate: Secondary | ICD-10-CM

## 2022-04-28 DIAGNOSIS — I10 Essential (primary) hypertension: Secondary | ICD-10-CM

## 2022-06-27 ENCOUNTER — Other Ambulatory Visit: Payer: Self-pay | Admitting: Family

## 2022-06-27 DIAGNOSIS — F321 Major depressive disorder, single episode, moderate: Secondary | ICD-10-CM

## 2022-06-27 DIAGNOSIS — F411 Generalized anxiety disorder: Secondary | ICD-10-CM

## 2022-06-27 DIAGNOSIS — G4700 Insomnia, unspecified: Secondary | ICD-10-CM

## 2022-06-27 NOTE — Telephone Encounter (Signed)
Last office visit 04/06/22 Last refill 04/06/22, #180, no refills

## 2022-07-02 ENCOUNTER — Other Ambulatory Visit: Payer: Self-pay | Admitting: Family

## 2022-07-02 DIAGNOSIS — E785 Hyperlipidemia, unspecified: Secondary | ICD-10-CM

## 2022-07-07 ENCOUNTER — Other Ambulatory Visit: Payer: Self-pay | Admitting: Family

## 2022-07-07 DIAGNOSIS — G43109 Migraine with aura, not intractable, without status migrainosus: Secondary | ICD-10-CM

## 2022-07-24 ENCOUNTER — Other Ambulatory Visit: Payer: Self-pay | Admitting: Family

## 2022-08-15 ENCOUNTER — Other Ambulatory Visit: Payer: Self-pay | Admitting: Family

## 2022-08-15 DIAGNOSIS — F411 Generalized anxiety disorder: Secondary | ICD-10-CM

## 2022-08-15 DIAGNOSIS — F321 Major depressive disorder, single episode, moderate: Secondary | ICD-10-CM

## 2022-09-07 ENCOUNTER — Other Ambulatory Visit: Payer: Self-pay | Admitting: Family Medicine

## 2022-09-07 ENCOUNTER — Other Ambulatory Visit: Payer: Self-pay | Admitting: Family

## 2022-09-07 DIAGNOSIS — F321 Major depressive disorder, single episode, moderate: Secondary | ICD-10-CM

## 2022-09-07 DIAGNOSIS — F411 Generalized anxiety disorder: Secondary | ICD-10-CM

## 2022-09-07 DIAGNOSIS — I10 Essential (primary) hypertension: Secondary | ICD-10-CM

## 2022-09-07 MED ORDER — GABAPENTIN 800 MG PO TABS: 800.0000 mg | ORAL_TABLET | Freq: Three times a day (TID) | ORAL | 0 refills | Status: DC

## 2022-09-18 ENCOUNTER — Ambulatory Visit: Payer: Medicaid Other | Admitting: Family

## 2022-09-18 ENCOUNTER — Other Ambulatory Visit (HOSPITAL_COMMUNITY)
Admission: RE | Admit: 2022-09-18 | Discharge: 2022-09-18 | Disposition: A | Discharge status: Home / Self Care | Payer: Medicaid Other | Source: Ambulatory Visit | Attending: Family | Admitting: Family

## 2022-09-18 ENCOUNTER — Encounter: Payer: Self-pay | Admitting: Family

## 2022-09-18 VITALS — BP 136/83 | HR 71 | Temp 97.4°F | Ht 63.0 in | Wt 143.2 lb

## 2022-09-18 DIAGNOSIS — G43109 Migraine with aura, not intractable, without status migrainosus: Secondary | ICD-10-CM | POA: Diagnosis not present

## 2022-09-18 DIAGNOSIS — G4700 Insomnia, unspecified: Secondary | ICD-10-CM | POA: Diagnosis not present

## 2022-09-18 DIAGNOSIS — Z01419 Encounter for gynecological examination (general) (routine) without abnormal findings: Secondary | ICD-10-CM

## 2022-09-18 DIAGNOSIS — F411 Generalized anxiety disorder: Secondary | ICD-10-CM

## 2022-09-18 DIAGNOSIS — I2510 Atherosclerotic heart disease of native coronary artery without angina pectoris: Secondary | ICD-10-CM

## 2022-09-18 DIAGNOSIS — E663 Overweight: Secondary | ICD-10-CM | POA: Diagnosis not present

## 2022-09-18 DIAGNOSIS — E785 Hyperlipidemia, unspecified: Secondary | ICD-10-CM

## 2022-09-18 DIAGNOSIS — I5032 Chronic diastolic (congestive) heart failure: Secondary | ICD-10-CM

## 2022-09-18 DIAGNOSIS — I10 Essential (primary) hypertension: Secondary | ICD-10-CM

## 2022-09-18 DIAGNOSIS — F321 Major depressive disorder, single episode, moderate: Secondary | ICD-10-CM | POA: Diagnosis not present

## 2022-09-18 DIAGNOSIS — I252 Old myocardial infarction: Secondary | ICD-10-CM

## 2022-09-18 MED ORDER — CETIRIZINE HCL 10 MG PO TABS: 10.0000 mg | ORAL_TABLET | Freq: Every day | ORAL | 1 refills | Status: DC

## 2022-09-18 MED ORDER — FUROSEMIDE 20 MG PO TABS: 20.0000 mg | ORAL_TABLET | Freq: Every day | ORAL | 3 refills | Status: DC

## 2022-09-18 MED ORDER — METOPROLOL TARTRATE 25 MG PO TABS: 25.0000 mg | ORAL_TABLET | Freq: Two times a day (BID) | ORAL | 0 refills | Status: DC

## 2022-09-18 MED ORDER — ATORVASTATIN CALCIUM 80 MG PO TABS: 80.0000 mg | ORAL_TABLET | Freq: Every day | ORAL | 0 refills | Status: DC

## 2022-09-18 MED ORDER — SERTRALINE HCL 100 MG PO TABS: ORAL_TABLET | ORAL | 2 refills | Status: DC

## 2022-09-18 MED ORDER — BUSPIRONE HCL 5 MG PO TABS: 5.0000 mg | ORAL_TABLET | Freq: Three times a day (TID) | ORAL | 0 refills | Status: DC | PRN

## 2022-09-18 MED ORDER — BUPROPION HCL ER (XL) 300 MG PO TB24: 300.0000 mg | ORAL_TABLET | Freq: Every day | ORAL | 0 refills | Status: DC

## 2022-09-18 MED ORDER — TRAZODONE HCL 50 MG PO TABS: 50.0000 mg | ORAL_TABLET | Freq: Every evening | ORAL | 0 refills | Status: DC | PRN

## 2022-09-18 MED ORDER — TOPIRAMATE 100 MG PO TABS: 100.0000 mg | ORAL_TABLET | Freq: Two times a day (BID) | ORAL | 0 refills | Status: DC

## 2022-09-18 NOTE — Patient Instructions (Signed)
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
## 2022-09-18 NOTE — Addendum Note (Signed)
Addended by: Jannifer Rodney A on: 09/18/2022 01:12 PM   Modules accepted: Level of Service

## 2022-09-18 NOTE — Addendum Note (Signed)
Addended by: Julious Payer D on: 09/18/2022 04:20 PM   Modules accepted: Orders

## 2022-09-19 LAB — CBC WITH DIFFERENTIAL/PLATELET
Basophils Absolute: 0 10*3/uL (ref 0.0–0.2)
Basos: 1 %
EOS (ABSOLUTE): 0.2 10*3/uL (ref 0.0–0.4)
Eos: 3 %
Hematocrit: 38.2 % (ref 34.0–46.6)
Hemoglobin: 12.3 g/dL (ref 11.1–15.9)
Immature Grans (Abs): 0 10*3/uL (ref 0.0–0.1)
Immature Granulocytes: 1 %
Lymphocytes Absolute: 1.3 10*3/uL (ref 0.7–3.1)
Lymphs: 20 %
MCH: 27.4 pg (ref 26.6–33.0)
MCHC: 32.2 g/dL (ref 31.5–35.7)
MCV: 85 fL (ref 79–97)
Monocytes Absolute: 0.3 10*3/uL (ref 0.1–0.9)
Monocytes: 5 %
Neutrophils Absolute: 4.6 10*3/uL (ref 1.4–7.0)
Neutrophils: 70 %
Platelets: 224 10*3/uL (ref 150–450)
RBC: 4.49 x10E6/uL (ref 3.77–5.28)
RDW: 14 % (ref 11.7–15.4)
WBC: 6.4 10*3/uL (ref 3.4–10.8)

## 2022-09-19 LAB — CMP14+EGFR
ALT: 13 IU/L (ref 0–32)
AST: 16 IU/L (ref 0–40)
Albumin/Globulin Ratio: 2 (ref 1.2–2.2)
Albumin: 4.4 g/dL (ref 3.8–4.9)
Alkaline Phosphatase: 86 IU/L (ref 44–121)
BUN/Creatinine Ratio: 12 (ref 9–23)
BUN: 8 mg/dL (ref 6–24)
Bilirubin Total: <0.2 mg/dL (ref 0.0–1.2)
CO2: 25 mmol/L (ref 20–29)
Calcium: 9.2 mg/dL (ref 8.7–10.2)
Chloride: 104 mmol/L (ref 96–106)
Creatinine, Ser: 0.67 mg/dL (ref 0.57–1.00)
Globulin, Total: 2.2 g/dL (ref 1.5–4.5)
Glucose: 106 mg/dL — ABNORMAL HIGH (ref 70–99)
Potassium: 4 mmol/L (ref 3.5–5.2)
Sodium: 143 mmol/L (ref 134–144)
Total Protein: 6.6 g/dL (ref 6.0–8.5)
eGFR: 102 mL/min/{1.73_m2} (ref >59)

## 2022-09-20 LAB — CYTOLOGY - PAP
Chlamydia: NEGATIVE
Comment: NEGATIVE
Comment: NEGATIVE
Comment: NORMAL
Diagnosis: NEGATIVE
Neisseria Gonorrhea: NEGATIVE
Trichomonas: NEGATIVE

## 2022-09-22 ENCOUNTER — Other Ambulatory Visit: Payer: Medicaid Other | Admitting: Licensed Clinical Social Worker

## 2022-09-22 NOTE — Patient Outreach (Signed)
  Medicaid Managed Care   Unsuccessful Attempt Note   09/22/2022 Name: Kristina Ramirez MRN: [id number] DOB: 1966-03-09  Referred by: Junie Spencer, FNP Reason for referral : No chief complaint on file.   An unsuccessful telephone outreach was attempted today. The patient was referred to the case management team for assistance with care management and care coordination.    Follow Up Plan: The Managed Medicaid care management team will reach out to the patient again over the next 30 days.   Dickie La, BSW, MSW, [employer] Managed Medicaid LCSW [facility]  [facility] [email or web address] Phone: [phone]

## 2022-10-10 ENCOUNTER — Other Ambulatory Visit: Payer: Self-pay | Admitting: Family

## 2022-10-10 DIAGNOSIS — F321 Major depressive disorder, single episode, moderate: Secondary | ICD-10-CM

## 2022-10-10 DIAGNOSIS — F411 Generalized anxiety disorder: Secondary | ICD-10-CM

## 2022-10-13 ENCOUNTER — Other Ambulatory Visit: Payer: Self-pay | Admitting: Family

## 2022-11-08 ENCOUNTER — Other Ambulatory Visit: Payer: Self-pay | Admitting: Family

## 2022-11-08 DIAGNOSIS — F321 Major depressive disorder, single episode, moderate: Secondary | ICD-10-CM

## 2022-11-08 DIAGNOSIS — G43109 Migraine with aura, not intractable, without status migrainosus: Secondary | ICD-10-CM

## 2022-11-08 DIAGNOSIS — G4700 Insomnia, unspecified: Secondary | ICD-10-CM

## 2022-11-08 DIAGNOSIS — F411 Generalized anxiety disorder: Secondary | ICD-10-CM

## 2022-11-08 NOTE — Telephone Encounter (Signed)
I called pt & made an appt w/[facility] on 12-21-2022.

## 2022-11-08 NOTE — Telephone Encounter (Signed)
[facility] NTBS in Feb for 3 mos FU RF sent

## 2022-11-19 ENCOUNTER — Other Ambulatory Visit: Payer: Self-pay | Admitting: Family

## 2022-11-19 DIAGNOSIS — E785 Hyperlipidemia, unspecified: Secondary | ICD-10-CM

## 2022-12-21 ENCOUNTER — Ambulatory Visit: Payer: Medicaid Other | Admitting: Family

## 2022-12-21 ENCOUNTER — Encounter: Payer: Self-pay | Admitting: Family

## 2022-12-21 VITALS — BP 121/76 | HR 74 | Temp 98.2°F | Resp 20 | Ht 63.0 in | Wt 134.0 lb

## 2022-12-21 DIAGNOSIS — F411 Generalized anxiety disorder: Secondary | ICD-10-CM

## 2022-12-21 DIAGNOSIS — I10 Essential (primary) hypertension: Secondary | ICD-10-CM | POA: Diagnosis not present

## 2022-12-21 DIAGNOSIS — M545 Low back pain, unspecified: Secondary | ICD-10-CM

## 2022-12-21 DIAGNOSIS — E785 Hyperlipidemia, unspecified: Secondary | ICD-10-CM | POA: Diagnosis not present

## 2022-12-21 DIAGNOSIS — I2510 Atherosclerotic heart disease of native coronary artery without angina pectoris: Secondary | ICD-10-CM | POA: Diagnosis not present

## 2022-12-21 DIAGNOSIS — G43109 Migraine with aura, not intractable, without status migrainosus: Secondary | ICD-10-CM | POA: Diagnosis not present

## 2022-12-21 DIAGNOSIS — I252 Old myocardial infarction: Secondary | ICD-10-CM | POA: Diagnosis not present

## 2022-12-21 DIAGNOSIS — F321 Major depressive disorder, single episode, moderate: Secondary | ICD-10-CM

## 2022-12-21 MED ORDER — BACLOFEN 10 MG PO TABS: 10.0000 mg | ORAL_TABLET | Freq: Three times a day (TID) | ORAL | 0 refills | Status: DC

## 2022-12-21 MED ORDER — CETIRIZINE HCL 10 MG PO TABS: 10.0000 mg | ORAL_TABLET | Freq: Every day | ORAL | 1 refills | Status: DC
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
## 2022-12-21 NOTE — Patient Instructions (Signed)

## 2022-12-22 LAB — CMP14+EGFR
ALT: 15 IU/L (ref 0–32)
AST: 18 IU/L (ref 0–40)
Albumin/Globulin Ratio: 2.1 (ref 1.2–2.2)
Albumin: 4.5 g/dL (ref 3.8–4.9)
Alkaline Phosphatase: 72 IU/L (ref 44–121)
BUN/Creatinine Ratio: 14 (ref 9–23)
BUN: 15 mg/dL (ref 6–24)
Bilirubin Total: 0.2 mg/dL (ref 0.0–1.2)
CO2: 19 mmol/L — ABNORMAL LOW (ref 20–29)
Calcium: 9 mg/dL (ref 8.7–10.2)
Chloride: 106 mmol/L (ref 96–106)
Creatinine, Ser: 1.04 mg/dL — ABNORMAL HIGH (ref 0.57–1.00)
Globulin, Total: 2.1 g/dL (ref 1.5–4.5)
Glucose: 93 mg/dL (ref 70–99)
Potassium: 4.3 mmol/L (ref 3.5–5.2)
Sodium: 140 mmol/L (ref 134–144)
Total Protein: 6.6 g/dL (ref 6.0–8.5)
eGFR: 63 mL/min/{1.73_m2} (ref >59)

## 2023-01-10 ENCOUNTER — Other Ambulatory Visit: Payer: Self-pay | Admitting: Family

## 2023-01-10 DIAGNOSIS — F411 Generalized anxiety disorder: Secondary | ICD-10-CM

## 2023-01-10 DIAGNOSIS — F321 Major depressive disorder, single episode, moderate: Secondary | ICD-10-CM

## 2023-01-10 DIAGNOSIS — I10 Essential (primary) hypertension: Secondary | ICD-10-CM

## 2023-02-05 ENCOUNTER — Other Ambulatory Visit: Payer: Self-pay | Admitting: Family

## 2023-02-05 DIAGNOSIS — M545 Low back pain, unspecified: Secondary | ICD-10-CM

## 2023-02-06 ENCOUNTER — Telehealth: Payer: Self-pay | Admitting: Family

## 2023-02-06 DIAGNOSIS — M545 Low back pain, unspecified: Secondary | ICD-10-CM

## 2023-02-06 NOTE — Telephone Encounter (Signed)
Pt aware refill sent to pharmacy today 

## 2023-02-06 NOTE — Telephone Encounter (Signed)
  Prescription Request  02/06/2023  Is this a "Controlled Substance" medicine? Not sure  Have you seen your PCP in the last 2 weeks? No, seen her on 12-21-2022 w/[facility]  If YES, route message to pool  -  If NO, patient needs to be scheduled for appointment.  What is the name of the medication or equipment? Baclofen  Have you contacted your pharmacy to request a refill? yes   Which pharmacy would you like this sent to? [facility]   Patient notified that their request is being sent to the clinical staff for review and that they should receive a response within 2 business days.    Please call pt.

## 2023-02-12 ENCOUNTER — Other Ambulatory Visit: Payer: Self-pay | Admitting: Family

## 2023-02-12 DIAGNOSIS — F411 Generalized anxiety disorder: Secondary | ICD-10-CM

## 2023-02-12 DIAGNOSIS — F321 Major depressive disorder, single episode, moderate: Secondary | ICD-10-CM

## 2023-02-12 DIAGNOSIS — G4700 Insomnia, unspecified: Secondary | ICD-10-CM

## 2023-04-08 ENCOUNTER — Other Ambulatory Visit: Payer: Self-pay | Admitting: Family

## 2023-04-08 DIAGNOSIS — E785 Hyperlipidemia, unspecified: Secondary | ICD-10-CM

## 2023-04-08 DIAGNOSIS — G43109 Migraine with aura, not intractable, without status migrainosus: Secondary | ICD-10-CM

## 2023-04-09 NOTE — Telephone Encounter (Signed)
[facility] NTBS in Aug for 6 mos FU refill sent to pharmacy

## 2023-04-09 NOTE — Telephone Encounter (Signed)
Made appt Aug 5

## 2023-04-30 ENCOUNTER — Other Ambulatory Visit: Payer: Self-pay | Admitting: Family

## 2023-04-30 DIAGNOSIS — F411 Generalized anxiety disorder: Secondary | ICD-10-CM

## 2023-04-30 DIAGNOSIS — F321 Major depressive disorder, single episode, moderate: Secondary | ICD-10-CM

## 2023-05-10 ENCOUNTER — Other Ambulatory Visit: Payer: Self-pay | Admitting: Family

## 2023-05-14 ENCOUNTER — Telehealth: Payer: Self-pay

## 2023-05-14 NOTE — Telephone Encounter (Signed)
Chart review completed for patient. Patient is due for screening mammogram. Mychart message sent to patient to inquire about scheduling mammogram.  Makaia Rappa, Population Health Specialist.  

## 2023-06-02 ENCOUNTER — Other Ambulatory Visit: Payer: Self-pay | Admitting: Family

## 2023-06-11 ENCOUNTER — Encounter: Payer: Self-pay | Admitting: Family

## 2023-06-11 ENCOUNTER — Ambulatory Visit: Payer: Medicaid Other | Admitting: Family

## 2023-07-11 ENCOUNTER — Other Ambulatory Visit: Payer: Self-pay | Admitting: Family

## 2023-07-11 DIAGNOSIS — F411 Generalized anxiety disorder: Secondary | ICD-10-CM

## 2023-07-11 DIAGNOSIS — F321 Major depressive disorder, single episode, moderate: Secondary | ICD-10-CM

## 2023-07-25 ENCOUNTER — Encounter: Payer: Self-pay | Admitting: Family

## 2023-07-25 ENCOUNTER — Telehealth: Payer: Self-pay | Admitting: Family

## 2023-07-25 ENCOUNTER — Other Ambulatory Visit: Payer: Self-pay | Admitting: Family

## 2023-07-25 DIAGNOSIS — F321 Major depressive disorder, single episode, moderate: Secondary | ICD-10-CM

## 2023-07-25 DIAGNOSIS — F411 Generalized anxiety disorder: Secondary | ICD-10-CM

## 2023-07-25 NOTE — Telephone Encounter (Signed)
[facility] pt NTBS 30-d given 07/11/23

## 2023-08-23 ENCOUNTER — Other Ambulatory Visit: Payer: Self-pay | Admitting: Family

## 2023-08-23 DIAGNOSIS — F321 Major depressive disorder, single episode, moderate: Secondary | ICD-10-CM

## 2023-08-23 DIAGNOSIS — M545 Low back pain, unspecified: Secondary | ICD-10-CM

## 2023-08-23 DIAGNOSIS — G43109 Migraine with aura, not intractable, without status migrainosus: Secondary | ICD-10-CM

## 2023-08-23 DIAGNOSIS — I10 Essential (primary) hypertension: Secondary | ICD-10-CM

## 2023-08-23 DIAGNOSIS — E785 Hyperlipidemia, unspecified: Secondary | ICD-10-CM

## 2023-08-23 DIAGNOSIS — F411 Generalized anxiety disorder: Secondary | ICD-10-CM

## 2023-08-24 NOTE — Patient Instructions (Signed)

## 2023-08-30 ENCOUNTER — Encounter: Payer: Self-pay | Admitting: Family

## 2023-08-30 ENCOUNTER — Ambulatory Visit (INDEPENDENT_AMBULATORY_CARE_PROVIDER_SITE_OTHER): Payer: Medicaid Other | Admitting: Family

## 2023-08-30 VITALS — BP 128/85 | HR 64 | Temp 97.7°F | Ht 63.0 in | Wt 130.2 lb

## 2023-08-30 DIAGNOSIS — M545 Low back pain, unspecified: Secondary | ICD-10-CM

## 2023-08-30 DIAGNOSIS — F411 Generalized anxiety disorder: Secondary | ICD-10-CM | POA: Diagnosis not present

## 2023-08-30 DIAGNOSIS — E785 Hyperlipidemia, unspecified: Secondary | ICD-10-CM

## 2023-08-30 DIAGNOSIS — I2510 Atherosclerotic heart disease of native coronary artery without angina pectoris: Secondary | ICD-10-CM | POA: Diagnosis not present

## 2023-08-30 DIAGNOSIS — G4700 Insomnia, unspecified: Secondary | ICD-10-CM

## 2023-08-30 DIAGNOSIS — G43109 Migraine with aura, not intractable, without status migrainosus: Secondary | ICD-10-CM | POA: Diagnosis not present

## 2023-08-30 DIAGNOSIS — Z0001 Encounter for general adult medical examination with abnormal findings: Secondary | ICD-10-CM

## 2023-08-30 DIAGNOSIS — I5032 Chronic diastolic (congestive) heart failure: Secondary | ICD-10-CM

## 2023-08-30 DIAGNOSIS — F321 Major depressive disorder, single episode, moderate: Secondary | ICD-10-CM | POA: Diagnosis not present

## 2023-08-30 DIAGNOSIS — I252 Old myocardial infarction: Secondary | ICD-10-CM

## 2023-08-30 DIAGNOSIS — I10 Essential (primary) hypertension: Secondary | ICD-10-CM | POA: Diagnosis not present

## 2023-08-30 MED ORDER — BACLOFEN 10 MG PO TABS: 10.0000 mg | ORAL_TABLET | Freq: Three times a day (TID) | ORAL | 2 refills | Status: DC

## 2023-08-30 MED ORDER — GABAPENTIN 800 MG PO TABS: 800.0000 mg | ORAL_TABLET | Freq: Three times a day (TID) | ORAL | 2 refills | Status: DC

## 2023-08-30 MED ORDER — SERTRALINE HCL 100 MG PO TABS: ORAL_TABLET | ORAL | 2 refills | Status: DC

## 2023-08-30 MED ORDER — ATORVASTATIN CALCIUM 80 MG PO TABS
80.0000 mg | ORAL_TABLET | Freq: Every day | ORAL | 1 refills | Status: DC
Start: 2023-08-30 — End: 2024-01-25

## 2023-08-30 MED ORDER — FUROSEMIDE 20 MG PO TABS: 20.0000 mg | ORAL_TABLET | Freq: Every day | ORAL | 3 refills | Status: DC

## 2023-08-30 MED ORDER — TOPIRAMATE 100 MG PO TABS: 100.0000 mg | ORAL_TABLET | Freq: Two times a day (BID) | ORAL | 2 refills | Status: DC

## 2023-08-30 MED ORDER — TRAZODONE HCL 50 MG PO TABS: 50.0000 mg | ORAL_TABLET | Freq: Every evening | ORAL | 1 refills | Status: DC | PRN

## 2023-08-30 MED ORDER — BUSPIRONE HCL 5 MG PO TABS: 5.0000 mg | ORAL_TABLET | Freq: Three times a day (TID) | ORAL | 1 refills | Status: DC | PRN

## 2023-08-30 MED ORDER — METOPROLOL TARTRATE 25 MG PO TABS: 25.0000 mg | ORAL_TABLET | Freq: Two times a day (BID) | ORAL | 1 refills | Status: DC

## 2023-08-30 NOTE — Progress Notes (Signed)
Subjective:    Patient ID: Kristina Ramirez, female    DOB: 1965-11-12, 57 y.o.   MRN: [id number]  Chief Complaint  Patient presents with   Medical Management of Chronic Issues   Pt presents to the office today for CPE and chronic follow up.    She has hx of NSTEMI and CHF, but has not seen Cardiologists in years.  Pt states her car was impounded and does not have a way to her appointments at this time. She has been kicked out of her apartment a month ago. She reports she has been staying with a friend.    She has atherosclerotic heart disease and  taking Lipitor.  Hypertension This is a chronic problem. The current episode started more than 1 year ago. The problem has been resolved since onset. The problem is controlled. Associated symptoms include anxiety and malaise/fatigue. Pertinent negatives include no peripheral edema or shortness of breath. Risk factors for coronary artery disease include dyslipidemia and sedentary lifestyle. The current treatment provides moderate improvement.  Congestive Heart Failure Presents for follow-up visit. Associated symptoms include fatigue. Pertinent negatives include no edema or shortness of breath. The symptoms have been stable.  Migraine  This is a chronic problem. The current episode started more than 1 year ago. Episode frequency: 3-4 times a week. The pain is located in the Occipital region. The symptoms are aggravated by emotional stress. She has tried beta blockers for the symptoms. The treatment provided moderate relief. Her past medical history is significant for hypertension.  Hyperlipidemia This is a chronic problem. The current episode started more than 1 year ago. Pertinent negatives include no shortness of breath. Current antihyperlipidemic treatment includes statins. The current treatment provides moderate improvement of lipids. Risk factors for coronary artery disease include dyslipidemia, hypertension and a sedentary lifestyle.  Depression         This is a chronic problem.  The current episode started more than 1 year ago.   Associated symptoms include fatigue, helplessness, hopelessness, restlessness and sad.  Past medical history includes anxiety.   Anxiety Presents for follow-up visit. Symptoms include excessive worry, nervous/anxious behavior and restlessness. Patient reports no shortness of breath. Symptoms occur occasionally.        Review of Systems  Constitutional:  Positive for fatigue and malaise/fatigue.  Respiratory:  Negative for shortness of breath.   Psychiatric/Behavioral:  Positive for depression. The patient is nervous/anxious.   All other systems reviewed and are negative.  Family History  Problem Relation Age of Onset   CAD Father    Social History   Socioeconomic History   Marital status: Divorced    Spouse name: Not on file   Number of children: Not on file   Years of education: Not on file   Highest education level: Not on file  Occupational History   Not on file  Tobacco Use   Smoking status: Former    Current packs/day: 0.00    Types: Cigarettes    Quit date: 12/05/2013    Years since quitting: 9.7   Smokeless tobacco: Never  Vaping Use   Vaping status: Never Used  Substance and Sexual Activity   Alcohol use: Never   Drug use: Never   Sexual activity: Yes  Other Topics Concern   Not on file  Social History Narrative   Not on file   Social Determinants of Health   Financial Resource Strain: High Risk (07/15/2021)   Overall Financial Resource Strain (CARDIA)    Difficulty of  Paying Living Expenses: Very hard  Food Insecurity: Food Insecurity Present (07/15/2021)   Hunger Vital Sign    Worried About Running Out of Food in the Last Year: Often true    Ran Out of Food in the Last Year: Often true  Transportation Needs: Unmet Transportation Needs (07/15/2021)   PRAPARE - Administrator, Civil Service (Medical): Yes    Lack of Transportation (Non-Medical): Yes  Physical  Activity: Not on file  Stress: Stress Concern Present (07/15/2021)   [employer] of Occupational Health - Occupational Stress Questionnaire    Feeling of Stress : Very much  Social Connections: Unknown (03/17/2022)   Received from [facility]   Social Network    Social Network: Not on file        Objective:   Physical Exam Vitals reviewed.  Constitutional:      General: She is not in acute distress.    Appearance: She is well-developed.  HENT:     Head: Normocephalic and atraumatic.     Right Ear: Tympanic membrane normal.     Left Ear: Tympanic membrane normal.  Eyes:     Pupils: Pupils are equal, round, and reactive to light.  Neck:     Thyroid: No thyromegaly.  Cardiovascular:     Rate and Rhythm: Normal rate and regular rhythm.     Heart sounds: Normal heart sounds. No murmur heard. Pulmonary:     Effort: Pulmonary effort is normal. No respiratory distress.     Breath sounds: Normal breath sounds. No wheezing.  Abdominal:     General: Bowel sounds are normal. There is no distension.     Palpations: Abdomen is soft.     Tenderness: There is no abdominal tenderness.  Musculoskeletal:        General: No tenderness. Normal range of motion.     Cervical back: Normal range of motion and neck supple.  Skin:    General: Skin is warm and dry.  Neurological:     Mental Status: She is alert and oriented to person, place, and time.     Cranial Nerves: No cranial nerve deficit.     Deep Tendon Reflexes: Reflexes are normal and symmetric.  Psychiatric:        Behavior: Behavior normal.        Thought Content: Thought content normal.        Judgment: Judgment normal.       BP 128/85   Pulse 64   Temp 97.7 F (36.5 C) (Temporal)   Ht 5\' 3"  (1.6 m)   Wt 130 lb 3.2 oz (59.1 kg)   SpO2 100%   BMI 23.06 kg/m      Assessment & Plan:   Jaklyn Souffrant comes in today with chief complaint of Medical Management of Chronic Issues   Diagnosis and orders  addressed:  1. Hyperlipidemia, unspecified hyperlipidemia type - atorvastatin (LIPITOR) 80 MG tablet; Take 1 tablet (80 mg total) by mouth daily. **NEEDS TO BE SEEN BEFORE NEXT REFILL**  Dispense: 90 tablet; Refill: 1 - CMP14+EGFR - CBC with Differential/Platelet - Lipid panel  2. Acute midline low back pain without sciatica - baclofen (LIORESAL) 10 MG tablet; Take 1 tablet (10 mg total) by mouth 3 (three) times daily.  Dispense: 180 tablet; Refill: 2 - CMP14+EGFR - CBC with Differential/Platelet  3. Generalized anxiety disorder - busPIRone (BUSPAR) 5 MG tablet; Take 1 tablet (5 mg total) by mouth 3 (three) times daily as needed.  Dispense:  270 tablet; Refill: 1 - sertraline (ZOLOFT) 100 MG tablet; TAKE 1 AND 1/2 TABLETS (150 MG TOTAL) DAILY **NEEDS TO BE SEEN BEFORE NEXT REFILL**  Dispense: 135 tablet; Refill: 2 - CMP14+EGFR - CBC with Differential/Platelet  4. Depression, major, single episode, moderate (HCC) - busPIRone (BUSPAR) 5 MG tablet; Take 1 tablet (5 mg total) by mouth 3 (three) times daily as needed.  Dispense: 270 tablet; Refill: 1 - sertraline (ZOLOFT) 100 MG tablet; TAKE 1 AND 1/2 TABLETS (150 MG TOTAL) DAILY **NEEDS TO BE SEEN BEFORE NEXT REFILL**  Dispense: 135 tablet; Refill: 2 - CMP14+EGFR - CBC with Differential/Platelet  5. Essential (primary) hypertension - furosemide (LASIX) 20 MG tablet; Take 1 tablet (20 mg total) by mouth daily. (Needs to be seen before next refill)  Dispense: 90 tablet; Refill: 3 - metoprolol tartrate (LOPRESSOR) 25 MG tablet; Take 1 tablet (25 mg total) by mouth 2 (two) times daily. **NEEDS TO BE SEEN BEFORE NEXT REFILL**  Dispense: 180 tablet; Refill: 1 - CMP14+EGFR - CBC with Differential/Platelet  6. Migraine with aura and without status migrainosus, not intractable - topiramate (TOPAMAX) 100 MG tablet; Take 1 tablet (100 mg total) by mouth 2 (two) times daily. **NEEDS TO BE SEEN BEFORE NEXT REFILL**  Dispense: 180 tablet; Refill: 2 -  CMP14+EGFR - CBC with Differential/Platelet  7. Insomnia, unspecified type - traZODone (DESYREL) 50 MG tablet; Take 1-2 tablets (50-100 mg total) by mouth at bedtime as needed. for sleep  Dispense: 180 tablet; Refill: 1 - CMP14+EGFR - CBC with Differential/Platelet  8. History of non-ST elevation myocardial infarction (NSTEMI) - CMP14+EGFR - CBC with Differential/Platelet  9. Chronic diastolic CHF (congestive heart failure) (HCC) - CMP14+EGFR - CBC with Differential/Platelet  10. Atherosclerosis of native coronary artery without angina pectoris, unspecified whether native or transplanted heart - CMP14+EGFR - CBC with Differential/Platelet   Labs pending Continue current medications  Health Maintenance reviewed Diet and exercise encouraged  Follow up plan: 3 months    Jannifer Rodney, FNP

## 2023-08-31 LAB — CBC WITH DIFFERENTIAL/PLATELET
Basophils Absolute: 0 10*3/uL (ref 0.0–0.2)
Basos: 1 %
EOS (ABSOLUTE): 0.2 10*3/uL (ref 0.0–0.4)
Eos: 3 %
Hematocrit: 36.5 % (ref 34.0–46.6)
Hemoglobin: 11.7 g/dL (ref 11.1–15.9)
Immature Grans (Abs): 0 10*3/uL (ref 0.0–0.1)
Immature Granulocytes: 0 %
Lymphocytes Absolute: 2 10*3/uL (ref 0.7–3.1)
Lymphs: 36 %
MCH: 28.4 pg (ref 26.6–33.0)
MCHC: 32.1 g/dL (ref 31.5–35.7)
MCV: 89 fL (ref 79–97)
Monocytes Absolute: 0.3 10*3/uL (ref 0.1–0.9)
Monocytes: 6 %
Neutrophils Absolute: 3.1 10*3/uL (ref 1.4–7.0)
Neutrophils: 54 %
Platelets: 192 10*3/uL (ref 150–450)
RBC: 4.12 x10E6/uL (ref 3.77–5.28)
RDW: 14.1 % (ref 11.7–15.4)
WBC: 5.6 10*3/uL (ref 3.4–10.8)

## 2023-08-31 LAB — CMP14+EGFR
ALT: 13 [IU]/L (ref 0–32)
AST: 12 [IU]/L (ref 0–40)
Albumin: 4.3 g/dL (ref 3.8–4.9)
Alkaline Phosphatase: 74 [IU]/L (ref 44–121)
BUN/Creatinine Ratio: 13 (ref 9–23)
BUN: 12 mg/dL (ref 6–24)
Bilirubin Total: 0.2 mg/dL (ref 0.0–1.2)
CO2: 24 mmol/L (ref 20–29)
Calcium: 9 mg/dL (ref 8.7–10.2)
Chloride: 105 mmol/L (ref 96–106)
Creatinine, Ser: 0.96 mg/dL (ref 0.57–1.00)
Globulin, Total: 2.1 g/dL (ref 1.5–4.5)
Glucose: 82 mg/dL (ref 70–99)
Potassium: 4.2 mmol/L (ref 3.5–5.2)
Sodium: 142 mmol/L (ref 134–144)
Total Protein: 6.4 g/dL (ref 6.0–8.5)
eGFR: 69 mL/min/{1.73_m2} (ref >59)

## 2023-08-31 LAB — LIPID PANEL
Chol/HDL Ratio: 5.7 ratio — ABNORMAL HIGH (ref 0.0–4.4)
Cholesterol, Total: 199 mg/dL (ref 100–199)
HDL: 35 mg/dL — ABNORMAL LOW (ref >39)
LDL Chol Calc (NIH): 99 mg/dL (ref 0–99)
Triglycerides: 389 mg/dL — ABNORMAL HIGH (ref 0–149)
VLDL Cholesterol Cal: 65 mg/dL — ABNORMAL HIGH (ref 5–40)

## 2023-11-16 ENCOUNTER — Other Ambulatory Visit: Payer: Self-pay | Admitting: Family

## 2023-11-16 DIAGNOSIS — F411 Generalized anxiety disorder: Secondary | ICD-10-CM

## 2023-11-16 DIAGNOSIS — F321 Major depressive disorder, single episode, moderate: Secondary | ICD-10-CM

## 2023-12-03 ENCOUNTER — Other Ambulatory Visit: Payer: Self-pay | Admitting: Family

## 2023-12-03 DIAGNOSIS — F321 Major depressive disorder, single episode, moderate: Secondary | ICD-10-CM

## 2023-12-03 DIAGNOSIS — F411 Generalized anxiety disorder: Secondary | ICD-10-CM

## 2023-12-04 ENCOUNTER — Encounter: Payer: Self-pay | Admitting: Family

## 2023-12-04 NOTE — Telephone Encounter (Signed)
TRIED TO CALL PT / NO ANSWER AND NO VM MAILED LETTER

## 2023-12-04 NOTE — Telephone Encounter (Signed)
[facility] pt NTBS 30-d given 11/16/23

## 2024-01-24 ENCOUNTER — Other Ambulatory Visit: Payer: Self-pay | Admitting: Family

## 2024-01-24 DIAGNOSIS — G4700 Insomnia, unspecified: Secondary | ICD-10-CM

## 2024-01-25 ENCOUNTER — Other Ambulatory Visit: Payer: Self-pay | Admitting: Family

## 2024-01-25 DIAGNOSIS — G4700 Insomnia, unspecified: Secondary | ICD-10-CM

## 2024-01-25 DIAGNOSIS — F411 Generalized anxiety disorder: Secondary | ICD-10-CM

## 2024-01-25 DIAGNOSIS — M545 Low back pain, unspecified: Secondary | ICD-10-CM

## 2024-01-25 DIAGNOSIS — E785 Hyperlipidemia, unspecified: Secondary | ICD-10-CM

## 2024-01-25 DIAGNOSIS — I10 Essential (primary) hypertension: Secondary | ICD-10-CM

## 2024-01-25 DIAGNOSIS — F321 Major depressive disorder, single episode, moderate: Secondary | ICD-10-CM

## 2024-01-25 NOTE — Telephone Encounter (Signed)
 Pt is changing pharmacy's to mail order [facility]

## 2024-01-29 ENCOUNTER — Telehealth: Payer: Self-pay

## 2024-01-29 DIAGNOSIS — G43109 Migraine with aura, not intractable, without status migrainosus: Secondary | ICD-10-CM

## 2024-01-29 MED ORDER — GABAPENTIN 800 MG PO TABS: 800.0000 mg | ORAL_TABLET | Freq: Three times a day (TID) | ORAL | 0 refills | Status: DC

## 2024-01-29 MED ORDER — TOPIRAMATE 100 MG PO TABS: 100.0000 mg | ORAL_TABLET | Freq: Two times a day (BID) | ORAL | 0 refills | Status: DC

## 2024-01-29 NOTE — Addendum Note (Signed)
 Addended by: Julious Payer D on: 01/29/2024 01:58 PM   Modules accepted: Orders

## 2024-01-29 NOTE — Telephone Encounter (Signed)
 Copied from CRM [id number]. Topic: Clinical - Medication Question >> Jan 29, 2024 12:52 PM DeAngela L wrote: Reason for CRM: [facility] is waiting for a fax from the office for the rest of Patients medication to be faxed over [facility] will fax over another form call back number is [phone] fax number is [phone]

## 2024-02-18 ENCOUNTER — Encounter: Payer: Self-pay | Admitting: Family

## 2024-02-18 ENCOUNTER — Telehealth: Admitting: Family

## 2024-02-18 DIAGNOSIS — F411 Generalized anxiety disorder: Secondary | ICD-10-CM

## 2024-02-18 DIAGNOSIS — G4700 Insomnia, unspecified: Secondary | ICD-10-CM | POA: Insufficient documentation

## 2024-02-18 DIAGNOSIS — E785 Hyperlipidemia, unspecified: Secondary | ICD-10-CM

## 2024-02-18 DIAGNOSIS — F5101 Primary insomnia: Secondary | ICD-10-CM | POA: Diagnosis not present

## 2024-02-18 DIAGNOSIS — I252 Old myocardial infarction: Secondary | ICD-10-CM

## 2024-02-18 DIAGNOSIS — I5032 Chronic diastolic (congestive) heart failure: Secondary | ICD-10-CM

## 2024-02-18 DIAGNOSIS — I10 Essential (primary) hypertension: Secondary | ICD-10-CM

## 2024-02-18 DIAGNOSIS — F321 Major depressive disorder, single episode, moderate: Secondary | ICD-10-CM

## 2024-02-18 DIAGNOSIS — G43109 Migraine with aura, not intractable, without status migrainosus: Secondary | ICD-10-CM

## 2024-02-18 DIAGNOSIS — I2510 Atherosclerotic heart disease of native coronary artery without angina pectoris: Secondary | ICD-10-CM | POA: Diagnosis not present

## 2024-02-18 MED ORDER — ATORVASTATIN CALCIUM 80 MG PO TABS: 80.0000 mg | ORAL_TABLET | Freq: Every day | ORAL | 1 refills | Status: AC

## 2024-02-18 MED ORDER — NITROGLYCERIN 0.4 MG SL SUBL: 0.4000 mg | SUBLINGUAL_TABLET | SUBLINGUAL | 10 refills | Status: AC | PRN

## 2024-02-18 MED ORDER — SUMATRIPTAN SUCCINATE 100 MG PO TABS: 100.0000 mg | ORAL_TABLET | ORAL | 0 refills | Status: DC | PRN

## 2024-02-18 MED ORDER — TRAZODONE HCL 50 MG PO TABS: 50.0000 mg | ORAL_TABLET | Freq: Every evening | ORAL | 1 refills | Status: AC | PRN

## 2024-02-18 MED ORDER — SERTRALINE HCL 100 MG PO TABS: ORAL_TABLET | ORAL | 0 refills | Status: DC

## 2024-02-18 MED ORDER — TOPIRAMATE 100 MG PO TABS: 100.0000 mg | ORAL_TABLET | Freq: Two times a day (BID) | ORAL | 0 refills | Status: AC

## 2024-02-18 NOTE — Patient Instructions (Signed)

## 2024-02-18 NOTE — Progress Notes (Signed)
 Virtual Visit Consent   Kristina Ramirez, you are scheduled for a virtual visit with a  provider today. Just as with appointments in the office, your consent must be obtained to participate. Your consent will be active for this visit and any virtual visit you may have with one of our providers in the next 365 days. If you have a MyChart account, a copy of this consent can be sent to you electronically.  As this is a virtual visit, video technology does not allow for your provider to perform a traditional examination. This may limit your provider's ability to fully assess your condition. If your provider identifies any concerns that need to be evaluated in person or the need to arrange testing (such as labs, EKG, etc.), we will make arrangements to do so. Although advances in technology are sophisticated, we cannot ensure that it will always work on either your end or our end. If the connection with a video visit is poor, the visit may have to be switched to a telephone visit. With either a video or telephone visit, we are not always able to ensure that we have a secure connection.  By engaging in this virtual visit, you consent to the provision of healthcare and authorize for your insurance to be billed (if applicable) for the services provided during this visit. Depending on your insurance coverage, you may receive a charge related to this service.  I need to obtain your verbal consent now. Are you willing to proceed with your visit today? Kristina Ramirez has provided verbal consent on 02/18/2024 for a virtual visit (video or telephone). Kristina Fragmin, FNP  Date: 02/18/2024 11:40 AM   Virtual Visit via Video Note   I, Kristina Ramirez, connected with  Kristina Ramirez  ([id number], Jun 23, 1966) on 02/18/24 at 11:10 AM EDT by a video-enabled telemedicine application and verified that I am speaking with the correct person using two identifiers.  Location: Patient: Virtual Visit Location Patient: Home Provider:  Virtual Visit Location Provider: Home Office   I discussed the limitations of evaluation and management by telemedicine and the availability of in person appointments. The patient expressed understanding and agreed to proceed.    History of Present Illness: Kristina Ramirez is a 59 y.o. who identifies as a female who was assigned female at birth, and is being seen today for chronic follow up.    She has hx of NSTEMI and CHF, but has not seen Cardiologists in years.  Pt states her car was impounded and does not have a way to her appointments at this time. She has been kicked out of her apartment a month ago. She reports she has been staying with a friend.    She has atherosclerotic heart disease and  taking Lipitor. Kristina Ramirez  HPI: Hypertension This is a chronic problem. The current episode started more than 1 year ago. The problem has been resolved since onset. The problem is controlled. Associated symptoms include anxiety. Pertinent negatives include no malaise/fatigue, peripheral edema or shortness of breath. Risk factors for coronary artery disease include dyslipidemia and sedentary lifestyle. The current treatment provides moderate improvement. Hypertensive end-organ damage includes heart failure.  Congestive Heart Failure Presents for follow-up visit. Associated symptoms include fatigue. Pertinent negatives include no edema or shortness of breath. The symptoms have been stable.  Hyperlipidemia This is a chronic problem. The current episode started more than 1 year ago. The problem is uncontrolled. Recent lipid tests were reviewed and are high. Pertinent negatives include no shortness of  breath. Current antihyperlipidemic treatment includes statins. The current treatment provides mild improvement of lipids. Risk factors for coronary artery disease include dyslipidemia, hypertension, a sedentary lifestyle and post-menopausal.  Depression        This is a chronic problem.  The current episode started more than  1 year ago.   The problem occurs intermittently.  Associated symptoms include fatigue, helplessness, hopelessness, insomnia, restlessness and sad.  Past treatments include SSRIs - Selective serotonin reuptake inhibitors.  Past medical history includes anxiety.   Anxiety Presents for follow-up visit. Symptoms include excessive worry, insomnia, nervous/anxious behavior and restlessness. Patient reports no shortness of breath. Symptoms occur most days. The severity of symptoms is moderate.    Insomnia Primary symptoms: difficulty falling asleep, frequent awakening, no malaise/fatigue.   The current episode started more than one year. The onset quality is gradual. The problem occurs intermittently. Past treatments include medication. The treatment provided moderate relief. PMH includes: depression.   Migraine  This is a chronic problem. The current episode started more than 1 year ago. The problem occurs every few minutes (every other day). The problem has been waxing and waning. The pain is located in the Bilateral region. The pain quality is similar to prior headaches. The quality of the pain is described as aching and sharp. The pain is moderate. Associated symptoms include insomnia, phonophobia and photophobia. She has tried beta blockers for the symptoms. The treatment provided mild relief. Her past medical history is significant for hypertension.    Problems:  Patient Active Problem List   Diagnosis Date Noted   Insomnia 02/18/2024   Chronic diastolic CHF (congestive heart failure) (HCC) 04/22/2019   Depression, major, single episode, moderate (HCC) 10/21/2018   Migraine 10/21/2018   Atherosclerotic heart disease of native coronary artery without angina pectoris 01/25/2015   Anxiety disorder 01/18/2015   Essential (primary) hypertension 01/18/2015   Hyperlipidemia 01/18/2015   History of non-ST elevation myocardial infarction (NSTEMI) 01/17/2015    Allergies: No Known  Allergies Medications:  Current Outpatient Medications:    SUMAtriptan (IMITREX) 100 MG tablet, Take 1 tablet (100 mg total) by mouth every 2 (two) hours as needed for migraine. May repeat in 2 hours if headache persists or recurs. Max 200 mg/24 hour, Disp: 20 tablet, Rfl: 0   aspirin EC 81 MG tablet, Take 1 tablet (81 mg total) by mouth daily., Disp: 90 tablet, Rfl: 3   atorvastatin (LIPITOR) 80 MG tablet, Take 1 tablet (80 mg total) by mouth daily., Disp: 90 tablet, Rfl: 1   baclofen (LIORESAL) 10 MG tablet, TAKE ONE (1) TABLET BY MOUTH TWICE DAILY *NEW PRESCRIPTION REQUEST*, Disp: 180 tablet, Rfl: 10   buPROPion (WELLBUTRIN XL) 300 MG 24 hr tablet, TAKE 1 TABLET BY MOUTH EVERY DAY *NEW PRESCRIPTION REQUEST*, Disp: 90 tablet, Rfl: 0   busPIRone (BUSPAR) 5 MG tablet, TAKE 1 TABLET BY MOUTH THREE TIMES DAILY AS NEEDED *NEW PRESCRIPTION REQUEST*, Disp: 270 tablet, Rfl: 0   cetirizine (ALLERGY RELIEF CETIRIZINE) 10 MG tablet, TAKE 1 TABLET BY MOUTH EVERY DAY *NEW PRESCRIPTION REQUEST*, Disp: 90 tablet, Rfl: 1   furosemide (LASIX) 20 MG tablet, TAKE 1 TABLET BY MOUTH EVERY DAY *NEW PRESCRIPTION REQUEST*, Disp: 90 tablet, Rfl: 0   gabapentin (NEURONTIN) 800 MG tablet, Take 1 tablet (800 mg total) by mouth 3 (three) times daily., Disp: 270 tablet, Rfl: 0   metoprolol tartrate (LOPRESSOR) 25 MG tablet, TAKE ONE (1) TABLET BY MOUTH TWICE DAILY WITH FOOD *NEW PRESCRIPTION REQUEST*, Disp: 180 tablet, Rfl:  10   nitroGLYCERIN (NITROSTAT) 0.4 MG SL tablet, Place 1 tablet (0.4 mg total) under the tongue every 5 (five) minutes as needed for chest pain., Disp: 25 tablet, Rfl: 10   sertraline (ZOLOFT) 100 MG tablet, TAKE 1 & 1/2 TABLETS BY MOUTH ONCE DAILY *NEW PRESCRIPTION REQUEST*, Disp: 135 tablet, Rfl: 0   topiramate (TOPAMAX) 100 MG tablet, Take 1 tablet (100 mg total) by mouth 2 (two) times daily., Disp: 180 tablet, Rfl: 0   traZODone (DESYREL) 50 MG tablet, Take 1 tablet (50 mg total) by mouth at bedtime as  needed for sleep., Disp: 180 tablet, Rfl: 1  Observations/Objective: Patient is well-developed, well-nourished in no acute distress.  Resting comfortably  at home.  Head is normocephalic, atraumatic.  No labored breathing.  Speech is clear and coherent with logical content.  Patient is alert and oriented at baseline.   Assessment and Plan: 1. Chronic diastolic CHF (congestive heart failure) (HCC) - Ambulatory referral to Cardiology  2. Depression, major, single episode, moderate (HCC) - sertraline (ZOLOFT) 100 MG tablet; TAKE 1 & 1/2 TABLETS BY MOUTH ONCE DAILY *NEW PRESCRIPTION REQUEST*  Dispense: 135 tablet; Refill: 0  3. History of non-ST elevation myocardial infarction (NSTEMI) - nitroGLYCERIN (NITROSTAT) 0.4 MG SL tablet; Place 1 tablet (0.4 mg total) under the tongue every 5 (five) minutes as needed for chest pain.  Dispense: 25 tablet; Refill: 10 - atorvastatin (LIPITOR) 80 MG tablet; Take 1 tablet (80 mg total) by mouth daily.  Dispense: 90 tablet; Refill: 1 - Ambulatory referral to Cardiology  4. Hyperlipidemia, unspecified hyperlipidemia type - atorvastatin (LIPITOR) 80 MG tablet; Take 1 tablet (80 mg total) by mouth daily.  Dispense: 90 tablet; Refill: 1 - Ambulatory referral to Cardiology  5. Essential (primary) hypertension (Primary) - Ambulatory referral to Cardiology  6. Atherosclerosis of native coronary artery without angina pectoris, unspecified whether native or transplanted heart - Ambulatory referral to Cardiology  7. Generalized anxiety disorder - sertraline (ZOLOFT) 100 MG tablet; TAKE 1 & 1/2 TABLETS BY MOUTH ONCE DAILY *NEW PRESCRIPTION REQUEST*  Dispense: 135 tablet; Refill: 0  8. Primary insomnia  9. Migraine with aura and without status migrainosus, not intractable - topiramate (TOPAMAX) 100 MG tablet; Take 1 tablet (100 mg total) by mouth 2 (two) times daily.  Dispense: 180 tablet; Refill: 0 - Ambulatory referral to Neurology - SUMAtriptan  (IMITREX) 100 MG tablet; Take 1 tablet (100 mg total) by mouth every 2 (two) hours as needed for migraine. May repeat in 2 hours if headache persists or recurs. Max 200 mg/24 hour  Dispense: 20 tablet; Refill: 0  10. Insomnia, unspecified type - traZODone (DESYREL) 50 MG tablet; Take 1 tablet (50 mg total) by mouth at bedtime as needed for sleep.  Dispense: 180 tablet; Refill: 1  Will give Imitrex as needed for migraines Referral to Neurologists pending  Referral to Cardiologists  given  [facility] number given to make it to appointments RTO in 3 months   Follow Up Instructions: I discussed the assessment and treatment plan with the patient. The patient was provided an opportunity to ask questions and all were answered. The patient agreed with the plan and demonstrated an understanding of the instructions.  A copy of instructions were sent to the patient via MyChart unless otherwise noted below.    The patient was advised to call back or seek an in-person evaluation if the symptoms worsen or if the condition fails to improve as anticipated.    Jannifer Rodney, FNP

## 2024-03-25 ENCOUNTER — Other Ambulatory Visit: Payer: Self-pay | Admitting: *Deleted

## 2024-03-25 DIAGNOSIS — F411 Generalized anxiety disorder: Secondary | ICD-10-CM

## 2024-03-25 DIAGNOSIS — F321 Major depressive disorder, single episode, moderate: Secondary | ICD-10-CM

## 2024-03-25 MED ORDER — SERTRALINE HCL 100 MG PO TABS: ORAL_TABLET | ORAL | 0 refills | Status: DC

## 2024-04-09 ENCOUNTER — Encounter: Payer: Self-pay | Admitting: Family

## 2024-05-08 ENCOUNTER — Other Ambulatory Visit: Payer: Self-pay | Admitting: Family

## 2024-05-08 ENCOUNTER — Encounter: Payer: Self-pay | Admitting: Family

## 2024-05-08 DIAGNOSIS — F321 Major depressive disorder, single episode, moderate: Secondary | ICD-10-CM

## 2024-05-08 DIAGNOSIS — I10 Essential (primary) hypertension: Secondary | ICD-10-CM

## 2024-05-08 DIAGNOSIS — F411 Generalized anxiety disorder: Secondary | ICD-10-CM

## 2024-05-08 NOTE — Telephone Encounter (Signed)
 Christy NTBS for 3 mos FU from April RFs sent to pharmacy

## 2024-05-08 NOTE — Telephone Encounter (Signed)
 Pt does not have a phone # listed for home or cell, so not able to call her. I did call her daughter & LMTCB to make an appt w/[facility] for more refills on her meds. (I told daughter to let mom know about this!) Also, sent a letter about this!

## 2024-06-01 ENCOUNTER — Other Ambulatory Visit: Payer: Self-pay | Admitting: Family

## 2024-06-01 DIAGNOSIS — F321 Major depressive disorder, single episode, moderate: Secondary | ICD-10-CM

## 2024-06-01 DIAGNOSIS — F411 Generalized anxiety disorder: Secondary | ICD-10-CM

## 2024-06-06 ENCOUNTER — Other Ambulatory Visit: Payer: Self-pay | Admitting: Family

## 2024-08-05 ENCOUNTER — Other Ambulatory Visit: Payer: Self-pay | Admitting: Family

## 2024-08-05 DIAGNOSIS — F411 Generalized anxiety disorder: Secondary | ICD-10-CM

## 2024-08-05 DIAGNOSIS — F321 Major depressive disorder, single episode, moderate: Secondary | ICD-10-CM

## 2024-08-05 DIAGNOSIS — I10 Essential (primary) hypertension: Secondary | ICD-10-CM

## 2024-08-31 ENCOUNTER — Other Ambulatory Visit: Payer: Self-pay | Admitting: Family

## 2024-08-31 DIAGNOSIS — G43109 Migraine with aura, not intractable, without status migrainosus: Secondary | ICD-10-CM

## 2024-09-01 ENCOUNTER — Other Ambulatory Visit: Payer: Self-pay | Admitting: Family

## 2024-09-01 DIAGNOSIS — G43109 Migraine with aura, not intractable, without status migrainosus: Secondary | ICD-10-CM

## 2024-09-02 ENCOUNTER — Other Ambulatory Visit: Payer: Self-pay | Admitting: Family

## 2024-09-02 DIAGNOSIS — G43109 Migraine with aura, not intractable, without status migrainosus: Secondary | ICD-10-CM

## 2024-09-20 ENCOUNTER — Other Ambulatory Visit: Payer: Self-pay | Admitting: Family

## 2024-09-20 DIAGNOSIS — F321 Major depressive disorder, single episode, moderate: Secondary | ICD-10-CM

## 2024-09-20 DIAGNOSIS — F411 Generalized anxiety disorder: Secondary | ICD-10-CM

## 2024-09-20 DIAGNOSIS — I10 Essential (primary) hypertension: Secondary | ICD-10-CM

## 2024-09-22 ENCOUNTER — Encounter: Payer: Self-pay | Admitting: Family

## 2024-09-22 ENCOUNTER — Other Ambulatory Visit: Payer: Self-pay | Admitting: Family

## 2024-09-22 DIAGNOSIS — I10 Essential (primary) hypertension: Secondary | ICD-10-CM

## 2024-09-22 DIAGNOSIS — F321 Major depressive disorder, single episode, moderate: Secondary | ICD-10-CM

## 2024-09-22 DIAGNOSIS — F411 Generalized anxiety disorder: Secondary | ICD-10-CM

## 2024-09-22 NOTE — Telephone Encounter (Signed)
 Letter sent

## 2024-09-22 NOTE — Telephone Encounter (Unsigned)
 Copied from CRM #[id number]. Topic: Clinical - Medication Refill >> Sep 22, 2024 10:52 AM Alfonso ORN wrote: Medication: gabapentin  (NEURONTIN ) 800 MG tablet,metoprolol  tartrate (LOPRESSOR ) 25 MG tablet, busPIRone  (BUSPAR ) 5 MG tablet         Has the patient contacted their pharmacy? No, pharmacist called on behalf of pt (Agent: If no, request that the patient contact the pharmacy for the refill. If patient does not wish to contact the pharmacy document the reason why and proceed with request.) (Agent: If yes, when and what did the pharmacy advise?)  This is the patient's preferred pharmacy:   [facility], [place] - [address] [place] [address] Phone: [phone] Fax: [phone]  Is this the correct pharmacy for this prescription? Yes If no, delete pharmacy and type the correct one.   Has the prescription been filled recently? No  Is the patient out of the medication? Yes , only the busPIRone  (BUSPAR ) 5 MG tablet  Has the patient been seen for an appointment in the last year OR does the patient have an upcoming appointment? Yes  Can we respond through MyChart? No  Agent: Please be advised that Rx refills may take up to 3 business days. We ask that you follow-up with your pharmacy.

## 2024-09-22 NOTE — Telephone Encounter (Signed)
 Christy pt NTBS 30-d given 08/05/24

## 2024-09-22 NOTE — Telephone Encounter (Signed)
 Kristina Ramirez pt NTBS 30-d given 08/05/24

## 2024-09-23 ENCOUNTER — Other Ambulatory Visit: Payer: Self-pay | Admitting: Family

## 2024-09-23 DIAGNOSIS — F411 Generalized anxiety disorder: Secondary | ICD-10-CM

## 2024-09-23 DIAGNOSIS — G43109 Migraine with aura, not intractable, without status migrainosus: Secondary | ICD-10-CM

## 2024-09-23 DIAGNOSIS — F321 Major depressive disorder, single episode, moderate: Secondary | ICD-10-CM

## 2024-10-20 ENCOUNTER — Other Ambulatory Visit: Payer: Self-pay | Admitting: Family

## 2024-10-20 DIAGNOSIS — F321 Major depressive disorder, single episode, moderate: Secondary | ICD-10-CM

## 2024-10-20 DIAGNOSIS — I10 Essential (primary) hypertension: Secondary | ICD-10-CM

## 2024-10-20 DIAGNOSIS — F411 Generalized anxiety disorder: Secondary | ICD-10-CM

## 2024-10-20 DIAGNOSIS — G43109 Migraine with aura, not intractable, without status migrainosus: Secondary | ICD-10-CM

## 2024-10-20 NOTE — Telephone Encounter (Signed)
 Kristina Ramirez pt NTBS 30-d given 08/05/24

## 2024-10-21 ENCOUNTER — Encounter: Payer: Self-pay | Admitting: Family

## 2024-10-21 NOTE — Telephone Encounter (Signed)
 I called pt's daughter to let her know that her mom needs an appt with [facility] for med refills. I had to [facility]. Also, I sent pt a letter about this! (Pt needs to update phone # & update ALL her contacts-name & numbers.)

## 2024-10-22 ENCOUNTER — Other Ambulatory Visit: Payer: Self-pay | Admitting: Family

## 2024-10-22 DIAGNOSIS — G43109 Migraine with aura, not intractable, without status migrainosus: Secondary | ICD-10-CM

## 2024-11-19 ENCOUNTER — Other Ambulatory Visit: Payer: Self-pay | Admitting: Family

## 2024-11-20 ENCOUNTER — Other Ambulatory Visit: Payer: Self-pay | Admitting: Family

## 2024-11-20 DIAGNOSIS — G43109 Migraine with aura, not intractable, without status migrainosus: Secondary | ICD-10-CM
